# Patient Record
Sex: Female | Born: 1945 | Race: White | Hispanic: No | Smoking: Never smoker
Health system: Southern US, Community
[De-identification: ages and names within clinical notes are randomized; demographics above are authoritative.]

## PROBLEM LIST (undated history)

## (undated) DIAGNOSIS — R55 Syncope and collapse: Secondary | ICD-10-CM

## (undated) DIAGNOSIS — I10 Essential (primary) hypertension: Secondary | ICD-10-CM

## (undated) DIAGNOSIS — R079 Chest pain, unspecified: Secondary | ICD-10-CM

## (undated) HISTORY — DX: Chest pain, unspecified: R07.9

## (undated) HISTORY — PX: AUGMENTATION MAMMAPLASTY: SUR837

## (undated) HISTORY — DX: Syncope and collapse: R55

## (undated) HISTORY — PX: NASAL SINUS SURGERY: SHX719

---

## 2005-12-02 ENCOUNTER — Encounter: Admission: RE | Admit: 2005-12-02 | Discharge: 2005-12-02 | Payer: Self-pay | Admitting: *Deleted

## 2006-02-12 ENCOUNTER — Encounter: Admission: RE | Admit: 2006-02-12 | Discharge: 2006-02-12 | Payer: Self-pay | Admitting: *Deleted

## 2006-09-10 ENCOUNTER — Ambulatory Visit (HOSPITAL_COMMUNITY): Admission: RE | Admit: 2006-09-10 | Discharge: 2006-09-10 | Payer: Self-pay | Admitting: Plastic Surgery

## 2012-07-10 ENCOUNTER — Emergency Department (HOSPITAL_BASED_OUTPATIENT_CLINIC_OR_DEPARTMENT_OTHER): Payer: Federal, State, Local not specified - PPO

## 2012-07-10 ENCOUNTER — Emergency Department (HOSPITAL_BASED_OUTPATIENT_CLINIC_OR_DEPARTMENT_OTHER)
Admission: EM | Admit: 2012-07-10 | Discharge: 2012-07-10 | Disposition: A | Discharge status: Home / Self Care | Payer: Federal, State, Local not specified - PPO | Attending: Emergency Medicine | Admitting: Emergency Medicine

## 2012-07-10 ENCOUNTER — Encounter (HOSPITAL_BASED_OUTPATIENT_CLINIC_OR_DEPARTMENT_OTHER): Payer: Self-pay | Admitting: Emergency Medicine

## 2012-07-10 DIAGNOSIS — Z79899 Other long term (current) drug therapy: Secondary | ICD-10-CM | POA: Insufficient documentation

## 2012-07-10 DIAGNOSIS — R05 Cough: Secondary | ICD-10-CM

## 2012-07-10 DIAGNOSIS — R059 Cough, unspecified: Secondary | ICD-10-CM

## 2012-07-10 DIAGNOSIS — J9801 Acute bronchospasm: Secondary | ICD-10-CM

## 2012-07-10 DIAGNOSIS — J40 Bronchitis, not specified as acute or chronic: Secondary | ICD-10-CM

## 2012-07-10 DIAGNOSIS — J209 Acute bronchitis, unspecified: Secondary | ICD-10-CM | POA: Insufficient documentation

## 2012-07-10 LAB — CBC WITH DIFFERENTIAL/PLATELET
Basophils Relative: 0 % (ref 0–1)
Hemoglobin: 13.1 g/dL (ref 12.0–15.0)
MCH: 31 pg (ref 26.0–34.0)
MCHC: 34.1 g/dL (ref 30.0–36.0)
Monocytes Absolute: 0.6 10*3/uL (ref 0.1–1.0)
Monocytes Relative: 8 % (ref 3–12)
Neutro Abs: 4.9 10*3/uL (ref 1.7–7.7)
Platelets: 232 10*3/uL (ref 150–400)

## 2012-07-10 LAB — COMPREHENSIVE METABOLIC PANEL
ALT: 18 U/L (ref 0–35)
AST: 19 U/L (ref 0–37)
Albumin: 3.8 g/dL (ref 3.5–5.2)
Alkaline Phosphatase: 76 U/L (ref 39–117)
BUN: 8 mg/dL (ref 6–23)
CO2: 28 mEq/L (ref 19–32)
GFR calc Af Amer: >90 mL/min (ref >90)
GFR calc non Af Amer: 88 mL/min — ABNORMAL LOW (ref >90)
Glucose, Bld: 102 mg/dL — ABNORMAL HIGH (ref 70–99)
Potassium: 4.2 mEq/L (ref 3.5–5.1)
Total Bilirubin: 0.2 mg/dL — ABNORMAL LOW (ref 0.3–1.2)
Total Protein: 7.2 g/dL (ref 6.0–8.3)

## 2012-07-10 MED ORDER — ALBUTEROL SULFATE HFA 108 (90 BASE) MCG/ACT IN AERS: 2.0000 | INHALATION_SPRAY | RESPIRATORY_TRACT | Status: DC | PRN

## 2012-07-10 MED ORDER — IOHEXOL 350 MG/ML SOLN
100.0000 mL | Freq: Once | INTRAVENOUS | Status: AC | PRN
  Administered 2012-07-10: 100 mL via INTRAVENOUS

## 2012-07-10 MED ORDER — PREDNISONE 20 MG PO TABS: 60.0000 mg | ORAL_TABLET | Freq: Every day | ORAL | Status: DC

## 2012-07-10 MED ORDER — IPRATROPIUM BROMIDE 0.02 % IN SOLN
0.5000 mg | Freq: Once | RESPIRATORY_TRACT | Status: AC
  Administered 2012-07-10: 0.5 mg via RESPIRATORY_TRACT
  Filled 2012-07-10: qty 2.5

## 2012-07-10 MED ORDER — SODIUM CHLORIDE 0.9 % IV SOLN
INTRAVENOUS | Status: DC
  Administered 2012-07-10: 14:00:00 via INTRAVENOUS

## 2012-07-10 MED ORDER — ALBUTEROL SULFATE (5 MG/ML) 0.5% IN NEBU
5.0000 mg | INHALATION_SOLUTION | Freq: Once | RESPIRATORY_TRACT | Status: AC
  Administered 2012-07-10: 5 mg via RESPIRATORY_TRACT
  Filled 2012-07-10: qty 1

## 2012-07-10 MED ORDER — HYDROCOD POLST-CHLORPHEN POLST 10-8 MG/5ML PO LQCR: 5.0000 mL | Freq: Two times a day (BID) | ORAL | Status: DC | PRN

## 2012-07-10 MED ORDER — PREDNISONE 50 MG PO TABS
60.0000 mg | ORAL_TABLET | Freq: Once | ORAL | Status: AC
  Administered 2012-07-10: 60 mg via ORAL
  Filled 2012-07-10: qty 1

## 2012-07-10 MED ORDER — AZITHROMYCIN 250 MG PO TABS: ORAL_TABLET | ORAL | Status: DC

## 2012-07-10 NOTE — ED Provider Notes (Signed)
History     CSN: [id number]  Arrival date & time 07/10/12  1129   First MD Initiated Contact with Patient 07/10/12 1150      Chief Complaint  Patient presents with  . Cough    (Consider location/radiation/quality/duration/timing/severity/associated sxs/prior treatment) Patient is a 66 y.o. female presenting with cough. The history is provided by the patient.  Cough  pt states symptoms started a few weeks ago with sinus congestion. Was txd w amox x 10 days for possible sinus infection. Sinus congestion improved. But then developed cough, at times non productive, and other times yellow sputum. Went to urgent care in [place], and was told bronchitis, given emycin rx, states symptoms persist. Subjective fever. No night sweats. No wt loss. No hx chronic lung disease, asthma or copd. Non smoker. No current abx use.   No past medical history on file.  Past Surgical History  Procedure Date  . Nasal sinus surgery     No family history on file.  History  Substance Use Topics  . Smoking status: Never Smoker   . Smokeless tobacco: Not on file  . Alcohol Use:     OB History    Grav Para Term Preterm Abortions TAB SAB Ect Mult Living                  Review of Systems  Respiratory: Positive for cough.     Allergies  Codeine  Home Medications   Current Outpatient Rx  Name  Route  Sig  Dispense  Refill  . ESTROGENS CONJUGATED 0.625 MG PO TABS   Oral   Take 0.625 mg by mouth daily. Take daily for 21 days then do not take for 7 days.           BP 151/70  Pulse 91  Temp 98.2 F (36.8 C) (Oral)  Resp 18  Ht 5\' 1"  (1.549 m)  Wt 107 lb (48.535 kg)  BMI 20.22 kg/m2  SpO2 98%  Physical Exam  ED Course  Procedures (including critical care time)   Results for orders placed during the hospital encounter of 07/10/12  CBC WITH DIFFERENTIAL      Component Value Range   WBC 7.2  4.0 - 10.5 K/uL   RBC 4.22  3.87 - 5.11 MIL/uL   Hemoglobin 13.1  12.0 - 15.0 g/dL   HCT 30.8  65.7 - 84.6 %   MCV 91.0  78.0 - 100.0 fL   MCH 31.0  26.0 - 34.0 pg   MCHC 34.1  30.0 - 36.0 g/dL   RDW 96.2  95.2 - 84.1 %   Platelets 232  150 - 400 K/uL   Neutrophils Relative 67  43 - 77 %   Neutro Abs 4.9  1.7 - 7.7 K/uL   Lymphocytes Relative 24  12 - 46 %   Lymphs Abs 1.7  0.7 - 4.0 K/uL   Monocytes Relative 8  3 - 12 %   Monocytes Absolute 0.6  0.1 - 1.0 K/uL   Eosinophils Relative 1  0 - 5 %   Eosinophils Absolute 0.1  0.0 - 0.7 K/uL   Basophils Relative 0  0 - 1 %   Basophils Absolute 0.0  0.0 - 0.1 K/uL  COMPREHENSIVE METABOLIC PANEL      Component Value Range   Sodium 141  135 - 145 mEq/L   Potassium 4.2  3.5 - 5.1 mEq/L   Chloride 103  96 - 112 mEq/L   CO2 28  19 -  32 mEq/L   Glucose, Bld 102 (*) 70 - 99 mg/dL   BUN 8  6 - 23 mg/dL   Creatinine, Ser 6.96  0.50 - 1.10 mg/dL   Calcium 9.7  8.4 - 29.5 mg/dL   Total Protein 7.2  6.0 - 8.3 g/dL   Albumin 3.8  3.5 - 5.2 g/dL   AST 19  0 - 37 U/L   ALT 18  0 - 35 U/L   Alkaline Phosphatase 76  39 - 117 U/L   Total Bilirubin 0.2 (*) 0.3 - 1.2 mg/dL   GFR calc non Af Amer 88 (*) >90 mL/min   GFR calc Af Amer >90  >90 mL/min   Dg Chest 2 View  07/10/2012  *RADIOLOGY REPORT*  Clinical Data: Cough and bilateral chest pain.  CHEST - 2 VIEW  Comparison: None.  Findings: Two views of the chest demonstrate mild hyperinflation of the lungs.  There is no focal airspace disease or edema.  Heart size is within normal limits and the trachea is midline.  Bony thorax appears intact. There are subtle densities in the apical regions bilaterally that probably represent overlying shadows but indeterminate.  IMPRESSION: No acute cardiopulmonary disease.  Question apical densities, particularly on the left side.  This could represent overlying shadows but indeterminate.  This area could be further evaluation with a chest CT.   Original Report Authenticated By: Richarda Overlie, M.D.    Ct Chest W Contrast  07/10/2012  *RADIOLOGY REPORT*   Clinical Data: Possible apical nodules noted on recent chest x-ray. Exclude underlying malignancy.Persistent cough.  CT CHEST WITH CONTRAST  Technique:  Multidetector CT imaging of the chest was performed following the standard protocol during bolus administration of intravenous contrast.  Contrast: OMNIPAQUE IOHEXOL 350 MG/ML SOLN  Comparison: Chest x-ray 07/10/2012.  Findings:  Mediastinum: Heart size is normal. There is no significant pericardial fluid, thickening or pericardial calcification. No pathologically enlarged mediastinal or hilar lymph nodes. Esophagus is unremarkable in appearance.  Lungs/Pleura: Some linear opacities are present in the lower lobes of the lungs bilaterally, and may represent areas of subsegmental atelectasis and/or scarring.  This is very mild.  No acute consolidative airspace disease.  No pleural effusions.  No suspicious appearing pulmonary nodules or masses are identified.  Upper Abdomen: Unremarkable.  Musculoskeletal: Bilateral subglandular breast implants. There are no aggressive appearing lytic or blastic lesions noted in the visualized portions of the skeleton.  IMPRESSION: 1.  No suspicious findings to suggest malignancy.  Specifically, no apical nodules are identified. 2.  No acute findings in the thorax. 3.  Incidental findings, as above.   Original Report Authenticated By: Trudie Reed, M.D.       MDM  Cxr. Albuterol and atrovent neb.  Post neb, no wheezing. No increased wob.   ?broncospasm/bronchitis. Will rx abx, pred, alb mdi.         Suzi Roots, MD 07/10/12 [phone]

## 2012-07-10 NOTE — ED Notes (Signed)
Pt getting over recent sinus infection.  Treated with amoxicillin.  Pt continues to have productive cough, yellow sputum.  Pt also took Emycin pack which has not improved her symptoms.  Some fever.  Some sob.  Some chest soreness.

## 2014-07-17 ENCOUNTER — Telehealth: Payer: Self-pay | Admitting: Cardiovascular Disease

## 2014-07-17 NOTE — Telephone Encounter (Signed)
[facility]Dr Sharrell KuJeffrey [facility]) for appointment with Dr Tresa EndoKelly on 08/27/14.  [facility] Dr Landry DykeKelly's schedule on 08/27/14. lp

## 2014-08-09 ENCOUNTER — Encounter: Payer: Self-pay | Admitting: Cardiovascular Disease

## 2014-08-09 ENCOUNTER — Ambulatory Visit (INDEPENDENT_AMBULATORY_CARE_PROVIDER_SITE_OTHER): Payer: Federal, State, Local not specified - PPO | Admitting: Cardiovascular Disease

## 2014-08-09 VITALS — BP 149/78 | HR 66 | Resp 16 | Ht 61.0 in | Wt 110.0 lb

## 2014-08-09 DIAGNOSIS — R079 Chest pain, unspecified: Secondary | ICD-10-CM

## 2014-08-09 DIAGNOSIS — R072 Precordial pain: Secondary | ICD-10-CM

## 2014-08-09 NOTE — Patient Instructions (Signed)
Your physician has requested that you have an exercise tolerance test. For further information please visit [email or web address]. Please also follow instruction sheet, as given. We will call you with the results.

## 2014-08-11 ENCOUNTER — Encounter: Payer: Self-pay | Admitting: Cardiovascular Disease

## 2014-08-11 DIAGNOSIS — R079 Chest pain, unspecified: Secondary | ICD-10-CM

## 2014-08-11 HISTORY — DX: Chest pain, unspecified: R07.9

## 2014-08-11 NOTE — Progress Notes (Signed)
Patient ID: Sandra Bruce, female   DOB: Feb 27, 1946, 68 y.o.   MRN: [id number]      Reason for office visit Chest pain  Sandra Bruce is referred for evaluation of unrelenting moderate chest pain that has been going on for 3-4 weeks. It is present daily, all day long, although it varies in intensity. At times 8/10 in severity. It is retrosternal and radiates at times through to her back. It is not associated with, or worsened by physical activity. Meals do not worsen it, but she has noticed some temporary relief after burping (drinking a soda will help). She was scheduled for an evaluation with Dr. Kinnie ScalesMedoff, but he recommended cardiac evaluation first. His notes report that EGD performed 2012 for a similar complaint was negative. He recommended switching from PPI to famotidine, but the patient has not noticed improvement. Peptobismol does not help. She does have water brash and pyrosis, nausea without vomiting and poor appetite.  Chiropractor treatment did not help.  She has recently noticed dyspnea, after climbing a flight of stairs. Climbing stairs has no impact on the chest pain. No dyspnea during yoga. Over the summer she walked 1-2 miles/day, 3-4 days per week without difficulty.  An echo reportedly showed mild MR, otherwise normal. CT chest in 2013 (for qeustionable CXR detected nodule) showed no aortic abnormalities.  An ECG performed today (when pain reported 6/10) is normal.   She has a family history significant for premature CAD in her father, MI age 69. She was diagnosed with HTN this year. Her cholesterol is "OK" and she does not have DM.   Allergies  Allergen Reactions  . Codeine     Current Outpatient Prescriptions  Medication Sig Dispense Refill  . ALPRAZolam (XANAX PO) Take by mouth at bedtime as needed.    Marland Kitchen. estrogens, conjugated, (PREMARIN) 0.625 MG tablet Take 0.625 mg by mouth daily. Take daily for 21 days then do not take for 7 days.    . famotidine (PEPCID) 40 MG tablet  Take 40 mg by mouth 2 (two) times daily.    Marland Kitchen. olmesartan (BENICAR) 20 MG tablet Take 20 mg by mouth daily.    Marland Kitchen. zolpidem (AMBIEN) 5 MG tablet Take 5 mg by mouth at bedtime as needed for sleep.     No current facility-administered medications for this visit.    Past Medical History  Diagnosis Date  . Chest pain 08/11/2014    Past Surgical History  Procedure Laterality Date  . Nasal sinus surgery    Hysterectomy  Family History  Problem Relation Age of Onset  . Heart attack Father [id number]    History   Social History  . Marital Status: Divorced    Spouse Name: N/A    Number of Children: N/A  . Years of Education: N/A   Occupational History  . Not on file.   Social History Main Topics  . Smoking status: Never Smoker   . Smokeless tobacco: Not on file  . Alcohol Use: Not on file  . Drug Use: Not on file  . Sexual Activity: Not on file   Other Topics Concern  . Not on file   Social History Narrative    Review of systems: The patient specifically denies orthopnea, paroxysmal nocturnal dyspnea, syncope, palpitations, focal neurological deficits, intermittent claudication, lower extremity edema, unexplained weight gain, cough, hemoptysis or wheezing.  The patient also denies abdominal pain, vomiting, dysphagia, diarrhea, constipation, polyuria, polydipsia, dysuria, hematuria, frequency, urgency, abnormal bleeding or bruising, fever, chills, unexpected  weight changes, mood swings, change in skin or hair texture, change in voice quality, auditory or visual problems, allergic reactions or rashes, new musculoskeletal complaints other than usual "aches and pains".   PHYSICAL EXAM BP 149/78 mmHg  Pulse 66  Resp 16  Ht 5\' 1"  (1.549 m)  Wt 110 lb (49.896 kg)  BMI 20.80 kg/m2  General: Alert, oriented x3, no distress Head: no evidence of trauma, PERRL, EOMI, no exophtalmos or lid lag, no myxedema, no xanthelasma; normal ears, nose and oropharynx Neck: normal jugular venous  pulsations and no hepatojugular reflux; brisk carotid pulses without delay and no carotid bruits Chest: clear to auscultation, no signs of consolidation by percussion or palpation, normal fremitus, symmetrical and full respiratory excursions Cardiovascular: normal position and quality of the apical impulse, regular rhythm, normal first and second heart sounds, short early systolic murmur at LLSB, no rubs or gallops Abdomen: no tenderness or distention, no masses by palpation, no abnormal pulsatility or arterial bruits, normal bowel sounds, no hepatosplenomegaly Extremities: no clubbing, cyanosis or edema; 2+ radial, ulnar and brachial pulses bilaterally; 2+ right femoral, posterior tibial and dorsalis pedis pulses; 2+ left femoral, posterior tibial and dorsalis pedis pulses; no subclavian or femoral bruits Neurological: grossly nonfocal   EKG: NSR  Lipid Panel  No results found for: CHOL, TRIG, HDL, CHOLHDL, VLDL, LDLCALC, LDLDIRECT  BMET    Component Value Date/Time   NA 141 07/10/2012 1250   K 4.2 07/10/2012 1250   CL 103 07/10/2012 1250   CO2 28 07/10/2012 1250   GLUCOSE 102* 07/10/2012 1250   BUN 8 07/10/2012 1250   CREATININE 0.70 07/10/2012 1250   CALCIUM 9.7 07/10/2012 1250   GFRNONAA 88* 07/10/2012 1250   GFRAA >90 07/10/2012 1250     ASSESSMENT AND PLAN  Sandra Bruce chest pain is not typical for coronary etiology and the absence of any ECG changes during pain is reassuring. She has limited risk factors (family history, recent HTN). A stres test is indicated. The CT chest from 2013 makes an aortic aneurysm unlikely, but a dissection (while very unlikely) is possible. If her stress test and EGD are unrevealing, repeat chest CT is a consideration.  She has a history of early surgical menopause, fair skin and confirmed osteopenia - consider thoracic vertebral pathology as a possible cause of her symptoms. If no GI pathology is found, consider empirical NSAIDs.  Orders Placed  This Encounter  Procedures  . EKG 12-Lead  . Exercise Tolerance Test   Meds ordered this encounter  Medications  . olmesartan (BENICAR) 20 MG tablet    Sig: Take 20 mg by mouth daily.  Marland Kitchen. zolpidem (AMBIEN) 5 MG tablet    Sig: Take 5 mg by mouth at bedtime as needed for sleep.  Marland Kitchen. ALPRAZolam (XANAX PO)    Sig: Take by mouth at bedtime as needed.  . famotidine (PEPCID) 40 MG tablet    Sig: Take 40 mg by mouth 2 (two) times daily.    Junious SilkROITORU,Josip Merolla  Rastus Borton, MD, [facility] [phone] office [phone] pager

## 2014-08-27 ENCOUNTER — Ambulatory Visit: Payer: Federal, State, Local not specified - PPO | Admitting: Cardiovascular Disease

## 2014-08-31 ENCOUNTER — Encounter (HOSPITAL_COMMUNITY): Payer: Self-pay | Admitting: *Deleted

## 2014-09-06 ENCOUNTER — Telehealth (HOSPITAL_COMMUNITY): Payer: Self-pay

## 2014-09-06 NOTE — Telephone Encounter (Signed)
Encounter complete. 

## 2014-09-07 ENCOUNTER — Telehealth (HOSPITAL_COMMUNITY): Payer: Self-pay

## 2014-09-07 NOTE — Telephone Encounter (Signed)
Encounter complete. 

## 2014-09-11 ENCOUNTER — Ambulatory Visit (HOSPITAL_COMMUNITY)
Admission: RE | Admit: 2014-09-11 | Discharge: 2014-09-11 | Disposition: A | Discharge status: Home / Self Care | Payer: Federal, State, Local not specified - PPO | Source: Ambulatory Visit | Attending: Cardiology | Admitting: Cardiology

## 2014-09-11 DIAGNOSIS — R0609 Other forms of dyspnea: Secondary | ICD-10-CM | POA: Insufficient documentation

## 2014-09-11 DIAGNOSIS — R079 Chest pain, unspecified: Secondary | ICD-10-CM

## 2014-09-11 NOTE — Procedures (Signed)
Exercise Treadmill Test   Test  Exercise Tolerance Test Ordering MD: M. Croitoru, MD  Interpreting MD:   Unique Test No: 1 Treadmill:  1  InJudie Petitdication for ETT: chest pain - rule out ischemia  Contraindication to ETT: No   Stress Modality: exercise - treadmill  Cardiac Imaging Performed: non   Protocol: standard Bruce - maximal  Max BP:  169/89  Max MPHR (bpm):  151 85% MPR (bpm): 128  MPHR obtained (bpm):  146 % MPHR obtained: 96  Reached 85% MPHR (min:sec):  3:45 Total Exercise Time (min-sec):  6:07  Workload in METS:  7.10 Borg Scale:  Reason ETT Terminated: exertional dyspnea    ST Segment Analysis At Rest: normal ST segments - no evidence of significant ST depression With Exercise: no evidence of significant ST depression  Other Information Arrhythmia:  No Angina during ETT:  absent (0) Quality of ETT:  diagnostic  ETT Interpretation:  normal - no evidence of ischemia by ST analysis  Comments: Good exercise tolerance Flat BP response to exercise Scooped ST segments, but no significant ischemic changes  Sandra NoseKenneth C. Berneda Piccininni, MD, [facility] Attending Cardiologist [facility]

## 2016-10-30 ENCOUNTER — Other Ambulatory Visit: Payer: Self-pay | Admitting: Internal Medicine

## 2016-10-30 DIAGNOSIS — Z1231 Encounter for screening mammogram for malignant neoplasm of breast: Secondary | ICD-10-CM

## 2016-11-18 ENCOUNTER — Ambulatory Visit
Admission: RE | Admit: 2016-11-18 | Discharge: 2016-11-18 | Disposition: A | Discharge status: Home / Self Care | Payer: Federal, State, Local not specified - PPO | Source: Ambulatory Visit | Attending: Internal Medicine | Admitting: Internal Medicine

## 2016-11-18 DIAGNOSIS — Z1231 Encounter for screening mammogram for malignant neoplasm of breast: Secondary | ICD-10-CM

## 2017-12-08 ENCOUNTER — Emergency Department (HOSPITAL_COMMUNITY)
Admission: EM | Admit: 2017-12-08 | Discharge: 2017-12-08 | Disposition: A | Discharge status: Home / Self Care | Payer: Federal, State, Local not specified - PPO | Attending: Emergency Medicine | Admitting: Emergency Medicine

## 2017-12-08 ENCOUNTER — Emergency Department (HOSPITAL_BASED_OUTPATIENT_CLINIC_OR_DEPARTMENT_OTHER)
Admit: 2017-12-08 | Discharge: 2017-12-08 | Disposition: A | Discharge status: Home / Self Care | Payer: Federal, State, Local not specified - PPO | Attending: Emergency Medicine | Admitting: Emergency Medicine

## 2017-12-08 ENCOUNTER — Other Ambulatory Visit: Payer: Self-pay

## 2017-12-08 ENCOUNTER — Encounter (HOSPITAL_COMMUNITY): Payer: Self-pay

## 2017-12-08 DIAGNOSIS — I10 Essential (primary) hypertension: Secondary | ICD-10-CM | POA: Insufficient documentation

## 2017-12-08 DIAGNOSIS — Z79899 Other long term (current) drug therapy: Secondary | ICD-10-CM | POA: Diagnosis not present

## 2017-12-08 DIAGNOSIS — M7989 Other specified soft tissue disorders: Secondary | ICD-10-CM | POA: Diagnosis not present

## 2017-12-08 DIAGNOSIS — Y939 Activity, unspecified: Secondary | ICD-10-CM | POA: Insufficient documentation

## 2017-12-08 DIAGNOSIS — R609 Edema, unspecified: Secondary | ICD-10-CM | POA: Diagnosis not present

## 2017-12-08 DIAGNOSIS — R208 Other disturbances of skin sensation: Secondary | ICD-10-CM | POA: Insufficient documentation

## 2017-12-08 DIAGNOSIS — Y929 Unspecified place or not applicable: Secondary | ICD-10-CM | POA: Insufficient documentation

## 2017-12-08 DIAGNOSIS — S8002XA Contusion of left knee, initial encounter: Secondary | ICD-10-CM | POA: Diagnosis not present

## 2017-12-08 DIAGNOSIS — Y999 Unspecified external cause status: Secondary | ICD-10-CM | POA: Diagnosis not present

## 2017-12-08 DIAGNOSIS — W109XXA Fall (on) (from) unspecified stairs and steps, initial encounter: Secondary | ICD-10-CM | POA: Diagnosis not present

## 2017-12-08 DIAGNOSIS — M79605 Pain in left leg: Secondary | ICD-10-CM

## 2017-12-08 DIAGNOSIS — M79604 Pain in right leg: Secondary | ICD-10-CM

## 2017-12-08 DIAGNOSIS — S8001XA Contusion of right knee, initial encounter: Secondary | ICD-10-CM | POA: Insufficient documentation

## 2017-12-08 DIAGNOSIS — S8992XA Unspecified injury of left lower leg, initial encounter: Secondary | ICD-10-CM | POA: Diagnosis present

## 2017-12-08 HISTORY — DX: Essential (primary) hypertension: I10

## 2017-12-08 LAB — CBC WITH DIFFERENTIAL/PLATELET
Basophils Absolute: 0 10*3/uL (ref 0.0–0.1)
Basophils Relative: 0 %
EOS PCT: 1 %
Eosinophils Absolute: 0 10*3/uL (ref 0.0–0.7)
HCT: 38.9 % (ref 36.0–46.0)
Hemoglobin: 12.9 g/dL (ref 12.0–15.0)
LYMPHS ABS: 1.7 10*3/uL (ref 0.7–4.0)
Lymphocytes Relative: 36 %
MCH: 30.9 pg (ref 26.0–34.0)
MCHC: 33.2 g/dL (ref 30.0–36.0)
MCV: 93.3 fL (ref 78.0–100.0)
MONO ABS: 0.4 10*3/uL (ref 0.1–1.0)
MONOS PCT: 10 %
Neutro Abs: 2.5 10*3/uL (ref 1.7–7.7)
Neutrophils Relative %: 53 %
PLATELETS: 221 10*3/uL (ref 150–400)
RBC: 4.17 MIL/uL (ref 3.87–5.11)
RDW: 13.3 % (ref 11.5–15.5)
WBC: 4.6 10*3/uL (ref 4.0–10.5)

## 2017-12-08 LAB — BASIC METABOLIC PANEL
Anion gap: 11 (ref 5–15)
BUN: 13 mg/dL (ref 6–20)
CO2: 23 mmol/L (ref 22–32)
CREATININE: 0.72 mg/dL (ref 0.44–1.00)
Calcium: 9.5 mg/dL (ref 8.9–10.3)
Chloride: 105 mmol/L (ref 101–111)
GFR calc Af Amer: >60 mL/min (ref >60)
GLUCOSE: 97 mg/dL (ref 65–99)
Potassium: 4 mmol/L (ref 3.5–5.1)
SODIUM: 139 mmol/L (ref 135–145)

## 2017-12-08 NOTE — ED Triage Notes (Signed)
She c/o bilat. Lower leg swelling and feeling of "tighness". She cites recent car drive to [place] (~ 5 hours). She is in no distress and her breathing is normal. She states these symptoms are equal bilat.

## 2017-12-08 NOTE — ED Provider Notes (Signed)
[facility]   CSN: [id number] Arrival date & time: 12/08/17  0755     History   Chief Complaint Chief Complaint  Patient presents with  . Leg Swelling    HPI Sandra Bruce is a 72 y.o. female.  72 yo F with a chief complaint of bilateral leg pain.  This been going on for the past couple weeks.  The patient fell down a few steps a couple weeks ago and does not remember the exact incident.  She was ambulatory and so did not worry about her leg pain.  Now it is been about 2 weeks and she feels that the pain is gotten somewhat worse.  The worsening occurred after she drove to [place] and back over the weekend.  Describes a burning sensation to the medial aspect of both knees that radiates down to bilateral ankles.  Never had this pain before.  Continues to be able to ambulate without difficulty.  She denies headache neck pain chest pain abdominal pain back pain.  Denies loss of bowel or bladder denies loss perirectal sensation.  Denies leg weakness or numbness.  Denies any medication changes.  Has not seen another medical provider for this.  The history is provided by the patient.  Injury  This is a new problem. The current episode started more than 1 week ago. The problem occurs constantly. The problem has been gradually worsening. Pertinent negatives include no chest pain, no headaches and no shortness of breath. Nothing aggravates the symptoms. Nothing relieves the symptoms. She has tried nothing for the symptoms. The treatment provided no relief.    Past Medical History:  Diagnosis Date  . Chest pain 08/11/2014  . Hypertension     Patient Active Problem List   Diagnosis Date Noted  . Chest pain 08/11/2014    Past Surgical History:  Procedure Laterality Date  . AUGMENTATION MAMMAPLASTY Bilateral   . CESAREAN SECTION    . NASAL SINUS SURGERY       OB History   None      Home Medications    Prior to Admission  medications   Medication Sig Start Date End Date Taking? Authorizing Provider  ALPRAZolam (XANAX PO) Take by mouth at bedtime as needed.    [provider]  estrogens, conjugated, (PREMARIN) 0.625 MG tablet Take 0.625 mg by mouth daily. Take daily for 21 days then do not take for 7 days.    [provider]  famotidine (PEPCID) 40 MG tablet Take 40 mg by mouth 2 (two) times daily.    [provider]  olmesartan (BENICAR) 20 MG tablet Take 20 mg by mouth daily.    [provider]  zolpidem (AMBIEN) 5 MG tablet Take 5 mg by mouth at bedtime as needed for sleep.    [provider]    Family History Family History  Problem Relation Age of Onset  . Heart attack Father   . Breast cancer Neg Hx     Social History Social History   Tobacco Use  . Smoking status: Never Smoker  . Smokeless tobacco: Never Used  Substance Use Topics  . Alcohol use: Not on file  . Drug use: Not on file     Allergies   Codeine and Hydrocodone   Review of Systems Review of Systems  Constitutional: Negative for chills and fever.  HENT: Negative for congestion and rhinorrhea.   Eyes: Negative for redness and visual disturbance.  Respiratory: Negative for  shortness of breath and wheezing.   Cardiovascular: Negative for chest pain and palpitations.  Gastrointestinal: Negative for nausea and vomiting.  Genitourinary: Negative for dysuria and urgency.  Musculoskeletal: Positive for arthralgias and myalgias.  Skin: Negative for pallor and wound.  Neurological: Negative for dizziness and headaches.     Physical Exam Updated Vital Signs BP (!) 164/90 (BP Location: Left Arm)   Pulse (!) 55   Temp 97.7 F (36.5 C) (Oral)   Resp 17   SpO2 97%   Physical Exam  Constitutional: She is oriented to person, place, and time. She appears well-developed and well-nourished. No distress.  HENT:  Head: Normocephalic and atraumatic.  Eyes: Pupils are equal, round, and  reactive to light. EOM are normal.  Neck: Normal range of motion. Neck supple.  Cardiovascular: Normal rate and regular rhythm. Exam reveals no gallop and no friction rub.  No murmur heard. Pulmonary/Chest: Effort normal. She has no wheezes. She has no rales.  Abdominal: Soft. She exhibits no distension. There is no tenderness.  Musculoskeletal: She exhibits no edema or tenderness.  Bruises to anterior aspect of both knees.  PMS intact distally bilaterally.  No clonus, reflexes 2+ and equal.  Negative babinski.   Neurological: She is alert and oriented to person, place, and time. She displays no Babinski's sign on the right side. She displays no Babinski's sign on the left side.  Reflex Scores:      Patellar reflexes are 2+ on the right side and 2+ on the left side.      Achilles reflexes are 2+ on the right side and 2+ on the left side. No clonus to BLE  Skin: Skin is warm and dry. She is not diaphoretic.  Psychiatric: She has a normal mood and affect. Her behavior is normal.  Nursing note and vitals reviewed.    ED Treatments / Results  Labs (all labs ordered are listed, but only abnormal results are displayed) Labs Reviewed  CBC WITH DIFFERENTIAL/PLATELET  BASIC METABOLIC PANEL    EKG None  Radiology No results found.  Procedures Procedures (including critical care time)  Medications Ordered in ED Medications - No data to display   Initial Impression / Assessment and Plan / ED Course  I have reviewed the triage vital signs and the nursing notes.  Pertinent labs & imaging results that were available during my care of the patient were reviewed by me and considered in my medical decision making (see chart for details).     72 yo F with bilateral leg pain.  Describes it as a burning sensation.  No history of this previously.  Posttraumatic.  The patient has no weakness no other signs of cauda equina no back pain.  Will obtain DVT study, basic labs. Negative.  PCP,  neurofollow up.  11:01 AM:  I have discussed the diagnosis/risks/treatment options with the patient and believe the pt to be eligible for discharge home to follow-up with PCP, neuro. We also discussed returning to the ED immediately if new or worsening sx occur. We discussed the sx which are most concerning (e.g., sudden worsening pain, fever, inability to tolerate by mouth ) that necessitate immediate return. Medications administered to the patient during their visit and any new prescriptions provided to the patient are listed below.  Medications given during this visit Medications - No data to display  Labs reviewed cbc, bmp unremarkable Images reviewed vas lab [place] with compressible veins DDX neuropathy, traumatic leg swelling,  Old records reviewed hx of  chronic neck pain with multiple visits.   The patient appears reasonably screen and/or stabilized for discharge and I doubt any other medical condition or other [facility] requiring further screening, evaluation, or treatment in the ED at this time prior to discharge.    Final Clinical Impressions(s) / ED Diagnoses   Final diagnoses:  Leg swelling  Pain in both lower extremities    ED Discharge Orders        Ordered    Ambulatory referral to Neurology    Comments:  Bilateral lower extremity paresthesias, new post trauma   12/08/17 1007       Melene Plan, DO 12/08/17 1101

## 2017-12-08 NOTE — Progress Notes (Signed)
Bilateral lower extremity venous duplex completed. There is no evidenceof a DVT, superficial thrombosis, or Baker's cyst/ [name], RVS 12/08/2017 10:05 AM

## 2017-12-08 NOTE — ED Notes (Signed)
ED Provider at bedside. 

## 2017-12-08 NOTE — Discharge Instructions (Signed)
Follow up with your PCP.  I think you should also follow up with neuro, they should call to schedule an appointment.

## 2017-12-30 ENCOUNTER — Other Ambulatory Visit: Payer: Self-pay | Admitting: Internal Medicine

## 2017-12-30 DIAGNOSIS — Z1231 Encounter for screening mammogram for malignant neoplasm of breast: Secondary | ICD-10-CM

## 2018-01-31 ENCOUNTER — Ambulatory Visit: Payer: Federal, State, Local not specified - PPO

## 2018-02-23 ENCOUNTER — Ambulatory Visit: Payer: Federal, State, Local not specified - PPO

## 2018-03-10 ENCOUNTER — Ambulatory Visit: Payer: Federal, State, Local not specified - PPO

## 2018-03-24 ENCOUNTER — Ambulatory Visit
Admission: RE | Admit: 2018-03-24 | Discharge: 2018-03-24 | Disposition: A | Discharge status: Home / Self Care | Payer: Federal, State, Local not specified - PPO | Source: Ambulatory Visit | Attending: Internal Medicine | Admitting: Internal Medicine

## 2018-03-24 DIAGNOSIS — Z1231 Encounter for screening mammogram for malignant neoplasm of breast: Secondary | ICD-10-CM

## 2020-02-15 ENCOUNTER — Ambulatory Visit: Payer: Federal, State, Local not specified - PPO | Admitting: Internal Medicine

## 2020-02-28 ENCOUNTER — Ambulatory Visit: Payer: Federal, State, Local not specified - PPO | Admitting: Internal Medicine

## 2020-02-28 ENCOUNTER — Other Ambulatory Visit: Payer: Self-pay

## 2020-02-28 ENCOUNTER — Encounter: Payer: Self-pay | Admitting: Internal Medicine

## 2020-02-28 VITALS — BP 140/90 | HR 64 | Ht 60.0 in | Wt 121.0 lb

## 2020-02-28 DIAGNOSIS — E785 Hyperlipidemia, unspecified: Secondary | ICD-10-CM | POA: Diagnosis not present

## 2020-02-28 DIAGNOSIS — R55 Syncope and collapse: Secondary | ICD-10-CM

## 2020-02-28 DIAGNOSIS — I10 Essential (primary) hypertension: Secondary | ICD-10-CM

## 2020-02-28 MED ORDER — LOSARTAN POTASSIUM 25 MG PO TABS
25.0000 mg | ORAL_TABLET | Freq: Every day | ORAL | 3 refills | Status: DC
Start: 2020-02-28 — End: 2020-03-13

## 2020-02-28 MED ORDER — HYDRALAZINE HCL 25 MG PO TABS: ORAL_TABLET | ORAL | 6 refills | Status: AC

## 2020-02-28 NOTE — Progress Notes (Signed)
Cardiology Office Note:    Date:  02/28/2020   ID:  Sandra Bruce, [place] 1945-08-25, MRN [id number]  PCP:  Domingo SepLokesh, Anitha, MD  Cardiologist:  Parke PoissonGayatri A Lyrica Mcclarty, MD  Electrophysiologist:  None   Referring MD: Domingo SepLokesh, Anitha, MD   Chief Complaint: syncope, HTN  History of Present Illness:    Sandra Bruce is a 74 y.o. female with a history of HTN, HLD, depression, insomnia. She has had very difficult to control HTN and has worked with her PCP Dr. Suzy BouchardLokesh in the past to optimize her regimen. She presents for evaluation of presyncopal episodes and HTN management.   She had two episodes of syncope, one a few years ago while walking and another recently [place] she reached the street level. Unclear if blood pressure is low at these time. She is intolerant of several medications for antihypertensive therapy. She has had a workup for secondary causes of HTN which was negative. She reports unremarkable cardiac testing with recent echo, and prior Holter. Previously seen in our practice for chest pain. Normal ETT 2016 - 6 min, 7 mets. Takes clonidine as her only antihypertensive at the moment. She is less inclined to take a beta blocker given prior experience. BP remains labile. Denies significant chest pain or shortness of breath currently.   Intolerances: Olmesartan - diarrhea which was significant Atenolol - nausea Chlorthalidone - nausea Toprol XL - dizziness  Other: Labetalol - ineffective.   Past Medical History:  Diagnosis Date  . Chest pain 08/11/2014  . Hypertension     Past Surgical History:  Procedure Laterality Date  . AUGMENTATION MAMMAPLASTY Bilateral   . CESAREAN SECTION    . NASAL SINUS SURGERY      Current Medications: Current Meds  Medication Sig  . ALPRAZolam (XANAX PO) Take 0.25 mg by mouth at bedtime as needed.   . Cyanocobalamin 5000 MCG SUBL Place 1 mg under the tongue daily.  .  CYANOCOBALAMIN PO Place 1 tablet under the tongue daily.  Marland Kitchen. estrogens, conjugated, (PREMARIN) 0.625 MG tablet Take 0.625 mg by mouth daily. Take daily for 21 days then do not take for 7 days.  . famotidine (PEPCID) 40 MG tablet Take 40 mg by mouth 2 (two) times daily.  Marland Kitchen. levothyroxine (SYNTHROID) 25 MCG tablet Take 1 tablet by mouth daily.  . mirtazapine (REMERON) 15 MG tablet Take 15 mg by mouth at bedtime.  . montelukast (SINGULAIR) 10 MG tablet Take 1 tablet by mouth as needed.  . Multiple Vitamin (MULTI-VITAMIN) tablet Take 1 tablet by mouth daily.  Marland Kitchen. omeprazole (PRILOSEC) 40 MG capsule Take 40 mg by mouth daily.  . [DISCONTINUED] cloNIDine (CATAPRES) 0.1 MG tablet Take 0.1 mg by mouth 2 (two) times daily.  . [DISCONTINUED] olmesartan (BENICAR) 20 MG tablet Take 20 mg by mouth daily.  . [DISCONTINUED] zolpidem (AMBIEN) 5 MG tablet Take 5 mg by mouth at bedtime as needed for sleep.     Allergies:   Codeine and Hydrocodone   Social History   Socioeconomic History  . Marital status: Divorced    Spouse name: Not on file  . Number of children: Not on file  . Years of education: Not on file  . Highest education level: Not on file  Occupational History  . Not on file  Tobacco Use  . Smoking status: Never Smoker  . Smokeless tobacco: Never Used  Substance and Sexual Activity  . Alcohol use:  Not on file  . Drug use: Not on file  . Sexual activity: Not on file  Other Topics Concern  . Not on file  Social History Narrative  . Not on file   Social Determinants of Health   Financial Resource Strain:   . Difficulty of Paying Living Expenses:   Food Insecurity:   . Worried About Programme researcher, broadcasting/film/video in the Last Year:   . Barista in the Last Year:   Transportation Needs:   . Freight forwarder (Medical):   Marland Kitchen Lack of Transportation (Non-Medical):   Physical Activity:   . Days of Exercise per Week:   . Minutes of Exercise per Session:   Stress:   . Feeling of Stress :    Social Connections:   . Frequency of Communication with Friends and Family:   . Frequency of Social Gatherings with Friends and Family:   . Attends Religious Services:   . Active Member of Clubs or Organizations:   . Attends Banker Meetings:   Marland Kitchen Marital Status:      Family History: The patient's family history includes Heart attack in her father. There is no history of Breast cancer.  ROS:   Please see the history of present illness.    All other systems reviewed and are negative.  EKGs/Labs/Other Studies Reviewed:    The following studies were reviewed today:  EKG:  NSR, rate 64.  Recent Labs: 03/12/2020: BUN 10; Creatinine, Ser 0.85; Potassium 4.3; Sodium 137  Recent Lipid Panel No results found for: CHOL, TRIG, HDL, CHOLHDL, VLDL, LDLCALC, LDLDIRECT  Physical Exam:    VS:  BP 140/90 (BP Location: Right Arm, Patient Position: Sitting, Cuff Size: Normal)   Pulse 64   Ht 5' (1.524 m)   Wt 121 lb (54.9 kg)   SpO2 96%   BMI 23.63 kg/m     Wt Readings from Last 5 Encounters:  03/13/20 123 lb (55.8 kg)  02/28/20 121 lb (54.9 kg)  08/09/14 110 lb (49.9 kg)  07/10/12 107 lb (48.5 kg)     Constitutional: No acute distress Eyes: sclera non-icteric, normal conjunctiva and lids ENMT: normal dentition, moist mucous membranes Cardiovascular: regular rhythm, normal rate, no murmurs. S1 and S2 normal.No jugular venous distention.  Respiratory: clear to auscultation bilaterally GI : normal bowel sounds, soft and nontender. No distention.   MSK: extremities warm, well perfused. No edema.  NEURO: grossly nonfocal exam, moves all extremities. PSYCH: alert and oriented x 3, normal mood and affect.   ASSESSMENT:    1. Essential hypertension   2. Syncope and collapse   3. Hyperlipidemia, unspecified hyperlipidemia type    PLAN:    Essential hypertension - Plan: EKG 12-Lead, Basic metabolic panel She is willing to try another ARB, since we discussed that  olmesartan can lead to drug-related enteropathy. We will try losartan 25 mg daily. We discussed that she can try prn hydralazine as opposed to scheduled for BP >160/100 as we titrate therapy. If she finds she needs to use it frequently, we will plan for scheduled dosing. We will stop clonidine in favor of losartan.  - I would like to refer her to Dr. Leonides Sake HTN clinic within our office for further evaluation.  - I will bring her back in 7-10 days for BMP and med titration with losartan.   Syncope and collapse  - She has had cardiovascular evaluations over the years that have been normal. I am not able to see  the report from her recent echocardiogram but she tells me this was unremarkable. She has had a cardiac monitor previously in the context of her prior syncopal episode. I have offered her repeat monitor and echo given recurrent episode of syncopal symptoms, we participated in shared decision making and she deferred. I am more suspicious of labile blood pressure than structural causes for hypotension and syncope, but we will observe these symptoms.   HLD - most recent values LDL 122, HD 89, Trig 118 from 2019. No recent lipid panel available. Consider repeat at follow up.    Weston Brass, MD [place]  [facility]    Medication Adjustments/Labs and Tests Ordered: Current medicines are reviewed at length with the patient today.  Concerns regarding medicines are outlined above.  Orders Placed This Encounter  Procedures  . Basic metabolic panel  . EKG 12-Lead   Meds ordered this encounter  Medications  . DISCONTD: losartan (COZAAR) 25 MG tablet    Sig: Take 1 tablet (25 mg total) by mouth daily.    Dispense:  90 tablet    Refill:  3  . hydrALAZINE (APRESOLINE) 25 MG tablet    Sig: Take  25 mg tablet twice a day if needed for blood pressure greater than 160/100    Dispense:  60 tablet    Refill:  6    Patient Instructions  Medication Instructions:    Stop taking  clonidine - may stop today    Losartan  25 mg one tablet  Daily   Take as needed Hydralazine 25 mg  Twice ady if blood pressure is greater than 160/100   *If you need a refill on your cardiac medications before your next appointment, please call your pharmacy*   Lab Work: BMP  In 7 to 10 days- the day before  Appointment   If you have labs (blood work) drawn today and your tests are completely normal, you will receive your results only by: Marland Kitchen MyChart Message (if you have MyChart) OR . A paper copy in the mail If you have any lab test that is abnormal or we need to change your treatment, we will call you to review the results.   Testing/Procedures: Not needed   Follow-Up: At [facility], you and your health needs are our priority.  As part of our continuing mission to provide you with exceptional heart care, we have created designated Provider Care Teams.  These Care Teams include your primary Cardiologist (physician) and Advanced Practice Providers (APPs -  Physician Assistants and Nurse Practitioners) who all work together to provide you with the care you need, when you need it.  We recommend signing up for the patient portal called "MyChart".  Sign up information is provided on this After Visit Summary.  MyChart is used to connect with patients for Virtual Visits (Telemedicine).  Patients are able to view lab/test results, encounter notes, upcoming appointments, etc.  Non-urgent messages can be sent to your provider as well.   To learn more about what you can do with MyChart, go to [email or web address].    Your next appointment:    7 TO 10 day(s)  The format for your next appointment:   In Person  Provider:   You may see  Dr Duke Salvia or one of the following Advanced Practice Providers on your designated Care Team:    Theodore Demark, PA-C  Joni Reining, DNP, ANP  Cadence Fransico Michael, NP    Other Instructions You have been referred to  DR  Duke Salvia - hypertension  clinic

## 2020-02-28 NOTE — Patient Instructions (Signed)
Medication Instructions:    Stop taking clonidine - may stop today    Losartan  25 mg one tablet  Daily   Take as needed Hydralazine 25 mg  Twice ady if blood pressure is greater than 160/100   *If you need a refill on your cardiac medications before your next appointment, please call your pharmacy*   Lab Work: BMP  In 7 to 10 days- the day before  Appointment   If you have labs (blood work) drawn today and your tests are completely normal, you will receive your results only by: Marland Kitchen MyChart Message (if you have MyChart) OR . A paper copy in the mail If you have any lab test that is abnormal or we need to change your treatment, we will call you to review the results.   Testing/Procedures: Not needed   Follow-Up: At [facility], you and your health needs are our priority.  As part of our continuing mission to provide you with exceptional heart care, we have created designated Provider Care Teams.  These Care Teams include your primary Cardiologist (physician) and Advanced Practice Providers (APPs -  Physician Assistants and Nurse Practitioners) who all work together to provide you with the care you need, when you need it.  We recommend signing up for the patient portal called "MyChart".  Sign up information is provided on this After Visit Summary.  MyChart is used to connect with patients for Virtual Visits (Telemedicine).  Patients are able to view lab/test results, encounter notes, upcoming appointments, etc.  Non-urgent messages can be sent to your provider as well.   To learn more about what you can do with MyChart, go to [email or web address].    Your next appointment:    7 TO 10 day(s)  The format for your next appointment:   In Person  Provider:   You may see  Dr Duke Salvia or one of the following Advanced Practice Providers on your designated Care Team:    Theodore Demark, PA-C  Joni Reining, DNP, ANP  Cadence Fransico Michael, NP    Other Instructions You have been  referred to  DR Duke Salvia - hypertension clinic

## 2020-03-12 LAB — BASIC METABOLIC PANEL
BUN/Creatinine Ratio: 12 (ref 12–28)
BUN: 10 mg/dL (ref 8–27)
CO2: 20 mmol/L (ref 20–29)
Calcium: 9.5 mg/dL (ref 8.7–10.3)
Chloride: 102 mmol/L (ref 96–106)
Creatinine, Ser: 0.85 mg/dL (ref 0.57–1.00)
GFR calc Af Amer: 78 mL/min/{1.73_m2} (ref >59)
GFR calc non Af Amer: 68 mL/min/{1.73_m2} (ref >59)
Glucose: 123 mg/dL — ABNORMAL HIGH (ref 65–99)
Potassium: 4.3 mmol/L (ref 3.5–5.2)
Sodium: 137 mmol/L (ref 134–144)

## 2020-03-13 ENCOUNTER — Ambulatory Visit: Payer: Federal, State, Local not specified - PPO | Admitting: Adult Health

## 2020-03-13 ENCOUNTER — Encounter: Payer: Self-pay | Admitting: Adult Health

## 2020-03-13 ENCOUNTER — Other Ambulatory Visit: Payer: Self-pay

## 2020-03-13 VITALS — BP 122/70 | HR 78 | Ht 60.0 in | Wt 123.0 lb

## 2020-03-13 DIAGNOSIS — E039 Hypothyroidism, unspecified: Secondary | ICD-10-CM

## 2020-03-13 DIAGNOSIS — I10 Essential (primary) hypertension: Secondary | ICD-10-CM | POA: Diagnosis not present

## 2020-03-13 MED ORDER — LOSARTAN POTASSIUM 25 MG PO TABS: 25.0000 mg | ORAL_TABLET | Freq: Every day | ORAL | 3 refills | Status: DC

## 2020-03-13 NOTE — Patient Instructions (Signed)
Medication Instructions:  Continue current medications  *If you need a refill on your cardiac medications before your next appointment, please call your pharmacy*   Lab Work: None Ordered   Testing/Procedures: Your physician has requested that you have an echocardiogram. Echocardiography is a painless test that uses sound waves to create images of your heart. It provides your doctor with information about the size and shape of your heart and how well your heart's chambers and valves are working. This procedure takes approximately one hour. There are no restrictions for this procedure.   Follow-Up: At [facility], you and your health needs are our priority.  As part of our continuing mission to provide you with exceptional heart care, we have created designated Provider Care Teams.  These Care Teams include your primary Cardiologist (physician) and Advanced Practice Providers (APPs -  Physician Assistants and Nurse Practitioners) who all work together to provide you with the care you need, when you need it.  We recommend signing up for the patient portal called "MyChart".  Sign up information is provided on this After Visit Summary.  MyChart is used to connect with patients for Virtual Visits (Telemedicine).  Patients are able to view lab/test results, encounter notes, upcoming appointments, etc.  Non-urgent messages can be sent to your provider as well.   To learn more about what you can do with MyChart, go to [email or web address].    Your next appointment:   Keep appointment with Dr Duke Salvia July 26th @ 3:30 pm  The format for your next appointment:   In Person  Provider:   Chilton Si, MD

## 2020-03-13 NOTE — Progress Notes (Signed)
Cardiology Office Note   Date:  03/13/2020   ID:  Camerin, Jimenez [birth year], MRN [id number]  PCP:  Domingo Sep, MD  Cardiologist:  Dr.Acharya  No chief complaint on file.    History of Present Illness: Sandra Bruce is a 74 y.o. female who presents for ongoing assessment and management of chest pain and hypertension.  She had been evaluated by Dr. Royann Shivers in 2015 and felt that it was atypical.  She had a stress test and an EGD that was unrevealing.    She saw Dr.Acharya on February 28, 2020 was placed on losartan and as needed hydralazine for blood pressure greater than 160/100.  She has kept a blood pressure log and initially blood pressure remained elevated for the first 2 days but is now come down into the 130s systolic with some low blood pressures of 117 and 110 systolic.  She has had a couple of "weak spells" but it was not related to blood pressure.  She had a weak spell with a low blood pressure of 110/70, and a weak spell with a blood pressure of 161/77.  She has only taken 4 doses of hydralazine over a 2-week period.   She states she has been feeling fine with the exception of those brief weak spells.  She denies denies any rapid heart rhythm, syncope or near syncope since starting losartan.  Follow-up labs, (March 12, 2020), sodium 137 potassium 4.3 chloride 102 creatinine 0.85.  Past Medical History:  Diagnosis Date  . Chest pain 08/11/2014  . Hypertension     Past Surgical History:  Procedure Laterality Date  . AUGMENTATION MAMMAPLASTY Bilateral   . CESAREAN SECTION    . NASAL SINUS SURGERY       Current Outpatient Medications  Medication Sig Dispense Refill  . ALPRAZolam (XANAX PO) Take 0.25 mg by mouth at bedtime as needed.     . Cyanocobalamin 5000 MCG SUBL Place 1 mg under the tongue daily.    . CYANOCOBALAMIN PO Place 1 tablet under the tongue daily.    Marland Kitchen estrogens, conjugated, (PREMARIN) 0.625 MG tablet Take 0.625 mg by mouth daily. Take daily for 21 days then do  not take for 7 days.    . famotidine (PEPCID) 40 MG tablet Take 40 mg by mouth 2 (two) times daily.    . hydrALAZINE (APRESOLINE) 25 MG tablet Take  25 mg tablet twice a day if needed for blood pressure greater than 160/100 60 tablet 6  . levothyroxine (SYNTHROID) 25 MCG tablet Take 1 tablet by mouth daily.    Marland Kitchen losartan (COZAAR) 25 MG tablet Take 1 tablet (25 mg total) by mouth daily. 90 tablet 3  . mirtazapine (REMERON) 15 MG tablet Take 15 mg by mouth at bedtime.    . montelukast (SINGULAIR) 10 MG tablet Take 1 tablet by mouth as needed.    . Multiple Vitamin (MULTI-VITAMIN) tablet Take 1 tablet by mouth daily.    Marland Kitchen omeprazole (PRILOSEC) 40 MG capsule Take 40 mg by mouth daily.     No current facility-administered medications for this visit.    Allergies:   Codeine and Hydrocodone    Social History:  The patient  reports that she has never smoked. She has never used smokeless tobacco.   Family History:  The patient's family history includes Heart attack in her father.    ROS: All other systems are reviewed and negative. Unless otherwise mentioned in H&P    PHYSICAL EXAM: VS:  BP 122/70  Pulse 78   Ht 5' (1.524 m)   Wt 123 lb (55.8 kg)   SpO2 96%   BMI 24.02 kg/m  , BMI Body mass index is 24.02 kg/m. GEN: Well nourished, well developed, in no acute distress HEENT: normal Neck: no JVD, carotid bruits, or masses Cardiac: RRR; no murmurs, rubs, or gallops,no edema  Respiratory:  Clear to auscultation bilaterally, normal work of breathing GI: soft, nontender, nondistended, + BS MS: no deformity or atrophy Skin: warm and dry, no rash Neuro:  Strength and sensation are intact Psych: euthymic mood, full affect   EKG: Not completed this office visit  Recent Labs: 03/12/2020: BUN 10; Creatinine, Ser 0.85; Potassium 4.3; Sodium 137    Lipid Panel No results found for: CHOL, TRIG, HDL, CHOLHDL, VLDL, LDLCALC, LDLDIRECT    Wt Readings from Last 3 Encounters:  03/13/20  123 lb (55.8 kg)  02/28/20 121 lb (54.9 kg)  08/09/14 110 lb (49.9 kg)      Other studies Reviewed: None available    ASSESSMENT AND PLAN:  1.  Hypertension: Apparently this has been difficult for her to control in the past.  She does not tolerate clonidine as discussed with significant diarrhea, and she did not tolerate Benicar for the same reason.  She states it usually takes about 8 months for her to be finding intolerances to her medication which is usually noted by more more frequent diarrhea while taking the medication.  When she stops the medication the diarrhea subsides.  I reviewed her labs and they are found to be normal.  I looked in Care Everywhere and saw that she did have an echocardiogram completed but it was not available to be read.  This was completed in 2019.  Note from [facility], Dr. Shawnee Knapp visit on December 21, 2019 documented that she had an ambulatory blood pressure monitor for 24 hours which demonstrated practically normal blood pressure readings.   I will repeat her echocardiogram for evaluation of LV function and for diastolic dysfunction.  She is due to follow-up with Dr. Duke Salvia on March 18, 2020.  2.  Hypothyroidism: She will follow-up with PCP for ongoing management of labs and dosing of levothyroxine.  Current medicines are reviewed at length with the patient today.  I have spent 25 minutes dedicated to the care of this patient on the date of this encounter to include pre-visit review of records, assessment, management and diagnostic testing,with shared decision making  Labs/ tests ordered today include: Echocardiogram  Bettey Mare. Liborio Nixon, ANP, AACC   03/13/2020 4:23 PM    [facility] [address] Office [phone] Fax [phone]  Notice: This dictation was prepared with Dragon dictation along with smaller phrase technology. Any transcriptional errors that result from this process are  unintentional and may not be corrected upon review.

## 2020-03-18 ENCOUNTER — Ambulatory Visit: Payer: Federal, State, Local not specified - PPO | Admitting: Cardiovascular Disease

## 2020-03-20 ENCOUNTER — Telehealth: Payer: Self-pay | Admitting: Internal Medicine

## 2020-03-20 NOTE — Telephone Encounter (Signed)
Vernona Rieger from the [employer] called. She would like office visit notes from her visit with Dr. Jacques Navy 02/28/20 faxed to her. Their fax number is  [phone]

## 2020-03-26 ENCOUNTER — Other Ambulatory Visit (HOSPITAL_COMMUNITY): Payer: Federal, State, Local not specified - PPO

## 2020-03-26 NOTE — Telephone Encounter (Signed)
I left message nformation given the requested office note has been faxed

## 2020-04-03 ENCOUNTER — Other Ambulatory Visit: Payer: Self-pay

## 2020-04-03 ENCOUNTER — Ambulatory Visit (HOSPITAL_COMMUNITY): Payer: Federal, State, Local not specified - PPO | Attending: Cardiology

## 2020-04-03 DIAGNOSIS — I10 Essential (primary) hypertension: Secondary | ICD-10-CM

## 2020-04-03 LAB — ECHOCARDIOGRAM COMPLETE
Area-P 1/2: 3.26 cm2
S' Lateral: 2 cm

## 2020-08-13 ENCOUNTER — Other Ambulatory Visit: Payer: Self-pay | Admitting: Internal Medicine

## 2021-01-29 ENCOUNTER — Other Ambulatory Visit: Payer: Self-pay | Admitting: Internal Medicine

## 2021-01-29 DIAGNOSIS — Z1231 Encounter for screening mammogram for malignant neoplasm of breast: Secondary | ICD-10-CM

## 2021-03-20 ENCOUNTER — Other Ambulatory Visit: Payer: Self-pay

## 2021-03-20 ENCOUNTER — Emergency Department (HOSPITAL_BASED_OUTPATIENT_CLINIC_OR_DEPARTMENT_OTHER): Payer: Federal, State, Local not specified - PPO | Admitting: Radiology

## 2021-03-20 ENCOUNTER — Emergency Department (HOSPITAL_BASED_OUTPATIENT_CLINIC_OR_DEPARTMENT_OTHER)
Admission: EM | Admit: 2021-03-20 | Discharge: 2021-03-20 | Disposition: A | Discharge status: Home / Self Care | Payer: Federal, State, Local not specified - PPO | Attending: Emergency Medicine | Admitting: Emergency Medicine

## 2021-03-20 ENCOUNTER — Emergency Department (HOSPITAL_BASED_OUTPATIENT_CLINIC_OR_DEPARTMENT_OTHER): Payer: Federal, State, Local not specified - PPO

## 2021-03-20 ENCOUNTER — Encounter (HOSPITAL_BASED_OUTPATIENT_CLINIC_OR_DEPARTMENT_OTHER): Payer: Self-pay | Admitting: Emergency Medicine

## 2021-03-20 DIAGNOSIS — M25532 Pain in left wrist: Secondary | ICD-10-CM | POA: Insufficient documentation

## 2021-03-20 DIAGNOSIS — M25531 Pain in right wrist: Secondary | ICD-10-CM | POA: Diagnosis not present

## 2021-03-20 DIAGNOSIS — S0993XA Unspecified injury of face, initial encounter: Secondary | ICD-10-CM | POA: Diagnosis present

## 2021-03-20 DIAGNOSIS — Z5321 Procedure and treatment not carried out due to patient leaving prior to being seen by health care provider: Secondary | ICD-10-CM | POA: Diagnosis not present

## 2021-03-20 DIAGNOSIS — S0181XA Laceration without foreign body of other part of head, initial encounter: Secondary | ICD-10-CM | POA: Insufficient documentation

## 2021-03-20 DIAGNOSIS — M25561 Pain in right knee: Secondary | ICD-10-CM | POA: Diagnosis not present

## 2021-03-20 DIAGNOSIS — W010XXA Fall on same level from slipping, tripping and stumbling without subsequent striking against object, initial encounter: Secondary | ICD-10-CM | POA: Insufficient documentation

## 2021-03-20 NOTE — ED Triage Notes (Signed)
Pt via pov from [facility] after tripping on a sidewalk and falling. Pt has abrasion to the bridge of her nose and two lacerations to the forehead. Bleeding is controlled at this time. Pt c/o pain to both wrists and right knee. Pt denies loc. Pt a&o x 4 during triage.

## 2021-03-20 NOTE — ED Notes (Signed)
Pt told registration she was leaving the facility due to wait time.

## 2021-04-06 ENCOUNTER — Other Ambulatory Visit: Payer: Self-pay | Admitting: Adult Health

## 2021-04-29 ENCOUNTER — Other Ambulatory Visit: Payer: Self-pay

## 2021-04-29 ENCOUNTER — Ambulatory Visit
Admission: RE | Admit: 2021-04-29 | Discharge: 2021-04-29 | Disposition: A | Discharge status: Home / Self Care | Payer: Federal, State, Local not specified - PPO | Source: Ambulatory Visit | Attending: Internal Medicine | Admitting: Internal Medicine

## 2021-04-29 DIAGNOSIS — Z1231 Encounter for screening mammogram for malignant neoplasm of breast: Secondary | ICD-10-CM

## 2021-05-19 ENCOUNTER — Other Ambulatory Visit: Payer: Self-pay | Admitting: Adult Health

## 2021-06-11 ENCOUNTER — Other Ambulatory Visit: Payer: Self-pay | Admitting: Internal Medicine

## 2021-06-17 ENCOUNTER — Other Ambulatory Visit: Payer: Self-pay

## 2021-06-17 MED ORDER — LOSARTAN POTASSIUM 25 MG PO TABS: 25.0000 mg | ORAL_TABLET | Freq: Every day | ORAL | 0 refills | Status: DC

## 2021-06-19 ENCOUNTER — Telehealth: Payer: Self-pay | Admitting: Internal Medicine

## 2021-06-19 MED ORDER — LOSARTAN POTASSIUM 25 MG PO TABS: 25.0000 mg | ORAL_TABLET | Freq: Every day | ORAL | 0 refills | Status: DC

## 2021-06-19 NOTE — Telephone Encounter (Signed)
Rx(s) sent to pharmacy electronically. Appointment scheduled for 07/23/21

## 2021-06-19 NOTE — Telephone Encounter (Signed)
*  STAT* If patient is at the pharmacy, call can be transferred to refill team.   1. Which medications need to be refilled? (please list name of each medication and dose if known) losartan (COZAAR) 25 MG tablet  2. Which pharmacy/location (including street and city if local pharmacy) is medication to be sent to? [id number] - [place] - [address]  3. Do they need a 30 day or 90 day supply? 90

## 2021-07-21 NOTE — Progress Notes (Addendum)
Cardiology Clinic Note   Patient Name: Sandra Bruce Date of Encounter: 07/23/2021  Primary Care Provider:  Domingo Sep, MD Primary Cardiologist:  Parke Poisson, MD  Patient Profile    Sandra Bruce presents the clinic today for follow-up evaluation of her hypertension and chest discomfort.   Past Medical History    Past Medical History:  Diagnosis Date   Chest pain 08/11/2014   Hypertension    Past Surgical History:  Procedure Laterality Date   AUGMENTATION MAMMAPLASTY Bilateral    CESAREAN SECTION     NASAL SINUS SURGERY      Allergies  Allergies  Allergen Reactions   Codeine    Hydrocodone Other (See Comments)    Hyperactivity   Wound Dressing Adhesive Rash    History of Present Illness    Sandra Bruce has a PMH of essential hypertension, hypothyroidism, atypical chest discomfort, and anxiety.  She was seen and evaluated by Dr. Royann Shivers in 2015.  At that time it was felt that her chest discomfort was atypical.  She had a stress test and an EGD that were unremarkable.  She was seen by [facility] 02/28/2020.  During that time she was placed on losartan and as needed hydralazine for blood pressure greater than 160/100.  She kept a blood pressure log.  She reported that her blood pressure remained elevated for the first 2 days and then came down into the 130 systolic range.  She reported blood pressures as low as 117-110 systolic.  She had reported a couple of which she called weak spells.  However, this was not related to her blood pressure.  She did note a weak spell with her blood pressure being 110/70 and also reported a weak spell with her blood pressure being 161/77.  She reported taking only 4 doses of hydralazine over a 2-week period.  She followed up with Joni Reining, DNP on 03/13/2020.  During that time she reported that she felt fine except for brief periods of weak episodes.  She denied rapid heart rate, syncope, presyncope since starting her losartan  medication.  Her repeat labs showed sodium 137, potassium 4.3, chloride 102, creatinine 0.85  She presents the clinic today for follow-up evaluation states she feels well.  She notes periods of weak spells a couple times a week.  This seems to be her baseline.  She relates this to labile blood pressure/getting up too fast.  We reviewed her previous echocardiogram and stress test.  She expressed understanding.  She stays well-hydrated drinking more than 64 ounces per day.  She reports that she eats a low-sodium diet.  She had labs drawn with her PCP which we will request.  I have asked her to monitor her blood pressure 2 times per week.  We will continue her current medication regimen and plan follow-up for 1 year.  Today she denies chest pain, shortness of breath, lower extremity edema, fatigue, palpitations, melena, hematuria, hemoptysis, diaphoresis, weakness, presyncope, syncope, orthopnea, and PND.   Home Medications    Prior to Admission medications   Medication Sig Start Date End Date Taking? Authorizing Provider  ALPRAZolam (XANAX PO) Take 0.25 mg by mouth at bedtime as needed.     [provider]  Cyanocobalamin 5000 MCG SUBL Place 1 mg under the tongue daily.    [provider]  CYANOCOBALAMIN PO Place 1 tablet under the tongue daily.    [provider]  estrogens, conjugated, (PREMARIN) 0.625 MG tablet Take 0.625 mg by  mouth daily. Take daily for 21 days then do not take for 7 days.    [provider]  famotidine (PEPCID) 40 MG tablet Take 40 mg by mouth 2 (two) times daily.    [provider]  hydrALAZINE (APRESOLINE) 25 MG tablet Take  25 mg tablet twice a day if needed for blood pressure greater than 160/100 02/28/20   Elouise Munroe, MD  levothyroxine (SYNTHROID) 25 MCG tablet Take 1 tablet by mouth daily. 02/01/20   [provider]  losartan (COZAAR) 25 MG tablet Take 1 tablet (25 mg total) by mouth daily. 06/19/21   Elouise Munroe, MD  mirtazapine (REMERON) 15 MG tablet Take 15 mg by mouth at bedtime. 02/09/20   [provider]  montelukast (SINGULAIR) 10 MG tablet Take 1 tablet by mouth as needed. 05/04/16   [provider]  Multiple Vitamin (MULTI-VITAMIN) tablet Take 1 tablet by mouth daily.    [provider]  omeprazole (PRILOSEC) 40 MG capsule Take 40 mg by mouth daily. 02/01/20   [provider]    Family History    Family History  Problem Relation Age of Onset   Heart attack Father    Breast cancer Neg Hx    She indicated that the status of her father is unknown. She indicated that the status of her neg hx is unknown.  Social History    Social History   Socioeconomic History   Marital status: Divorced    Spouse name: Not on file   Number of children: Not on file   Years of education: Not on file   Highest education level: Not on file  Occupational History   Not on file  Tobacco Use   Smoking status: Never   Smokeless tobacco: Never  Substance and Sexual Activity   Alcohol use: Yes    Alcohol/week: 4.0 standard drinks    Types: 4 Glasses of wine per week   Drug use: Never   Sexual activity: Not on file  Other Topics Concern   Not on file  Social History Narrative   Not on file   Social Determinants of Health   Financial Resource Strain: Not on file  Food Insecurity: Not on file  Transportation Needs: Not on file  Physical Activity: Not on file  Stress: Not on file  Social Connections: Not on file  Intimate Partner Violence: Not on file     Review of Systems    General:  No chills, fever, night sweats or weight changes.  Cardiovascular:  No chest pain, dyspnea on exertion, edema, orthopnea, palpitations, paroxysmal nocturnal dyspnea. Dermatological: No rash, lesions/masses Respiratory: No cough, dyspnea Urologic: No hematuria, dysuria Abdominal:   No nausea, vomiting, diarrhea, bright red blood per rectum, melena, or  hematemesis Neurologic:  No visual changes, wkns, changes in mental status. All other systems reviewed and are otherwise negative except as noted above.  Physical Exam    VS:  BP (!) 142/80   Pulse 69   Ht 5' 0.5" (1.537 m)   Wt 130 lb 3.2 oz (59.1 kg)   SpO2 97%   BMI 25.01 kg/m  , BMI Body mass index is 25.01 kg/m. GEN: Well nourished, well developed, in no acute distress. HEENT: normal. Neck: Supple, no JVD, carotid bruits, or masses. Cardiac: RRR, no murmurs, rubs, or gallops. No clubbing, cyanosis, edema.  Radials/DP/PT 2+ and equal bilaterally.  Respiratory:  Respirations regular and unlabored, clear to auscultation bilaterally. GI: Soft, nontender, nondistended, BS +  x 4. MS: no deformity or atrophy. Skin: warm and dry, no rash. Neuro:  Strength and sensation are intact. Psych: Normal affect.  Accessory Clinical Findings    Recent Labs: No results found for requested labs within last 8760 hours.   Recent Lipid Panel No results found for: CHOL, TRIG, HDL, CHOLHDL, VLDL, LDLCALC, LDLDIRECT  ECG personally reviewed by me today-normal sinus rhythm 69 bpm- No acute changes  Echocardiogram 04/03/2020 IMPRESSIONS     1. Left ventricular ejection fraction, by estimation, is 60 to 65%. The  left ventricle has normal function. The left ventricle has no regional  wall motion abnormalities. Left ventricular diastolic parameters are  consistent with Grade I diastolic  dysfunction (impaired relaxation).   2. Right ventricular systolic function is normal. The right ventricular  size is normal. There is normal pulmonary artery systolic pressure.   3. The mitral valve is normal in structure. No evidence of mitral valve  regurgitation. No evidence of mitral stenosis.   4. The aortic valve is normal in structure. Aortic valve regurgitation is  not visualized. Mild aortic valve sclerosis is present, with no evidence  of aortic valve stenosis.   5. The inferior vena cava is normal  in size with greater than 50%  respiratory variability, suggesting right atrial pressure of 3 mmHg.   Assessment & Plan   1.  Essential hypertension-BP today 142/80.  Continues to be labile at home.  Previously labile blood pressures.  Unable to tolerate clonidine due to diarrhea.  Unable to tolerate Benicar for the same reasons.  Previously reported 8 months before episodes of intolerance appear.  Has sensation of diarrhea when medication previously stopped.  Follow-up echocardiogram showed normal LVEF, G1 DD, mild aortic valve sclerosis and no other significant valvular abnormalities. Continue losartan, hydralazine as needed Heart healthy low-sodium diet-salty 6 given Increase physical activity as tolerated  Chest discomfort-no recent episodes of arm neck back or chest discomfort.  Previous stress testing unremarkable.  She is somewhat physically active walking and doing chores around the house.  Denies exertional chest pain for No plans for ischemic evaluation at this time Continue to monitor.  Hypothyroidism-on medical therapy.   Continue levothyroxine Follows with PCP  Disposition: Follow-up with Dr. Margaretann Loveless or me in 12 months.  Jossie Ng. Margy Sumler NP-C    07/23/2021, 9:55 AM [address] Office [phone] Fax [phone]  Notice: This dictation was prepared with Dragon dictation along with smaller phrase technology. Any transcriptional errors that result from this process are unintentional and may not be corrected upon review.  I spent 13 minutes examining this patient, reviewing medications, and using patient centered shared decision making involving her cardiac care.  Prior to her visit I spent greater than 20 minutes reviewing her past medical history,  medications, and prior cardiac tests.

## 2021-07-23 ENCOUNTER — Ambulatory Visit (INDEPENDENT_AMBULATORY_CARE_PROVIDER_SITE_OTHER): Payer: Federal, State, Local not specified - PPO | Admitting: General Practice

## 2021-07-23 ENCOUNTER — Encounter: Payer: Self-pay | Admitting: General Practice

## 2021-07-23 ENCOUNTER — Other Ambulatory Visit: Payer: Self-pay

## 2021-07-23 VITALS — BP 142/80 | HR 69 | Ht 60.5 in | Wt 130.2 lb

## 2021-07-23 DIAGNOSIS — I10 Essential (primary) hypertension: Secondary | ICD-10-CM

## 2021-07-23 DIAGNOSIS — R0789 Other chest pain: Secondary | ICD-10-CM | POA: Diagnosis not present

## 2021-07-23 DIAGNOSIS — E039 Hypothyroidism, unspecified: Secondary | ICD-10-CM | POA: Diagnosis not present

## 2021-07-23 NOTE — Patient Instructions (Addendum)
Medication Instructions:  The current medical regimen is effective;  continue present plan and medications as directed. Please refer to the Current Medication list given to you today.   *If you need a refill on your cardiac medications before your next appointment, please call your pharmacy*  Lab Work:   Testing/Procedures:  NONE    NONE  Special Instructions PLEASE READ AND FOLLOW SALTY 6-ATTACHED-1,800mg  daily  PLEASE CONTINUE PHYSICAL ACTIVITY AS TOLERATED   TAKE AND LOG YOUR BLOOD PRESSURE TWICE WEEKLY AND IF YOU HAVE ANY SYMPTOMS.  MAINTAIN YOUR HYDRATION  Follow-Up: Your next appointment:  12 month(s) In Person with Parke Poisson, MD  or Edd Fabian, FNP, Micah Flesher, PA-C, Marjie Skiff, PA-C, Juanda Crumble, PA-C, Joni Reining, DNP, ANP, Azalee Course, PA-C, or Bernadene Person, NP      Please call our office 2 months in advance to schedule this appointment   At [facility], you and your health needs are our priority.  As part of our continuing mission to provide you with exceptional heart care, we have created designated Provider Care Teams.  These Care Teams include your primary Cardiologist (physician) and Advanced Practice Providers (APPs -  Physician Assistants and Nurse Practitioners) who all work together to provide you with the care you need, when you need it.  We recommend signing up for the patient portal called "MyChart".  Sign up information is provided on this After Visit Summary.  MyChart is used to connect with patients for Virtual Visits (Telemedicine).  Patients are able to view lab/test results, encounter notes, upcoming appointments, etc.  Non-urgent messages can be sent to your provider as well.   To learn more about what you can do with MyChart, go to [email or web address].              6 SALTY THINGS TO AVOID     1,800MG  DAILY

## 2021-09-08 ENCOUNTER — Other Ambulatory Visit: Payer: Self-pay | Admitting: Internal Medicine

## 2021-09-15 ENCOUNTER — Telehealth: Payer: Self-pay | Admitting: Internal Medicine

## 2021-09-15 NOTE — Telephone Encounter (Signed)
°*  STAT* If patient is at the pharmacy, call can be transferred to refill team.   1. Which medications need to be refilled? (please list name of each medication and dose if known)  losartan (COZAAR) 25 MG tablet  2. Which pharmacy/location (including street and city if local pharmacy) is medication to be sent to? [facility] [place], [place] - [address]  3. Do they need a 30 day or 90 day supply? 90 with refills  Patient was seen 07/23/21 . She normally gets all of her medications for a full year. This last fill was only for 90 days. She would like a full year supply

## 2021-09-16 MED ORDER — LOSARTAN POTASSIUM 25 MG PO TABS: 25.0000 mg | ORAL_TABLET | Freq: Every day | ORAL | 3 refills | Status: DC

## 2021-09-16 NOTE — Telephone Encounter (Signed)
Sent refills to the pharmacy with enough to last patient until next annual appointment.

## 2022-03-19 IMAGING — DX DG WRIST COMPLETE 3+V*L*
4 series · 4 of 4 positions shown · non-contrast
Comparison: None.

CLINICAL DATA: Fall

EXAM:
LEFT WRIST - COMPLETE 3+ VIEW

[wrist ap]
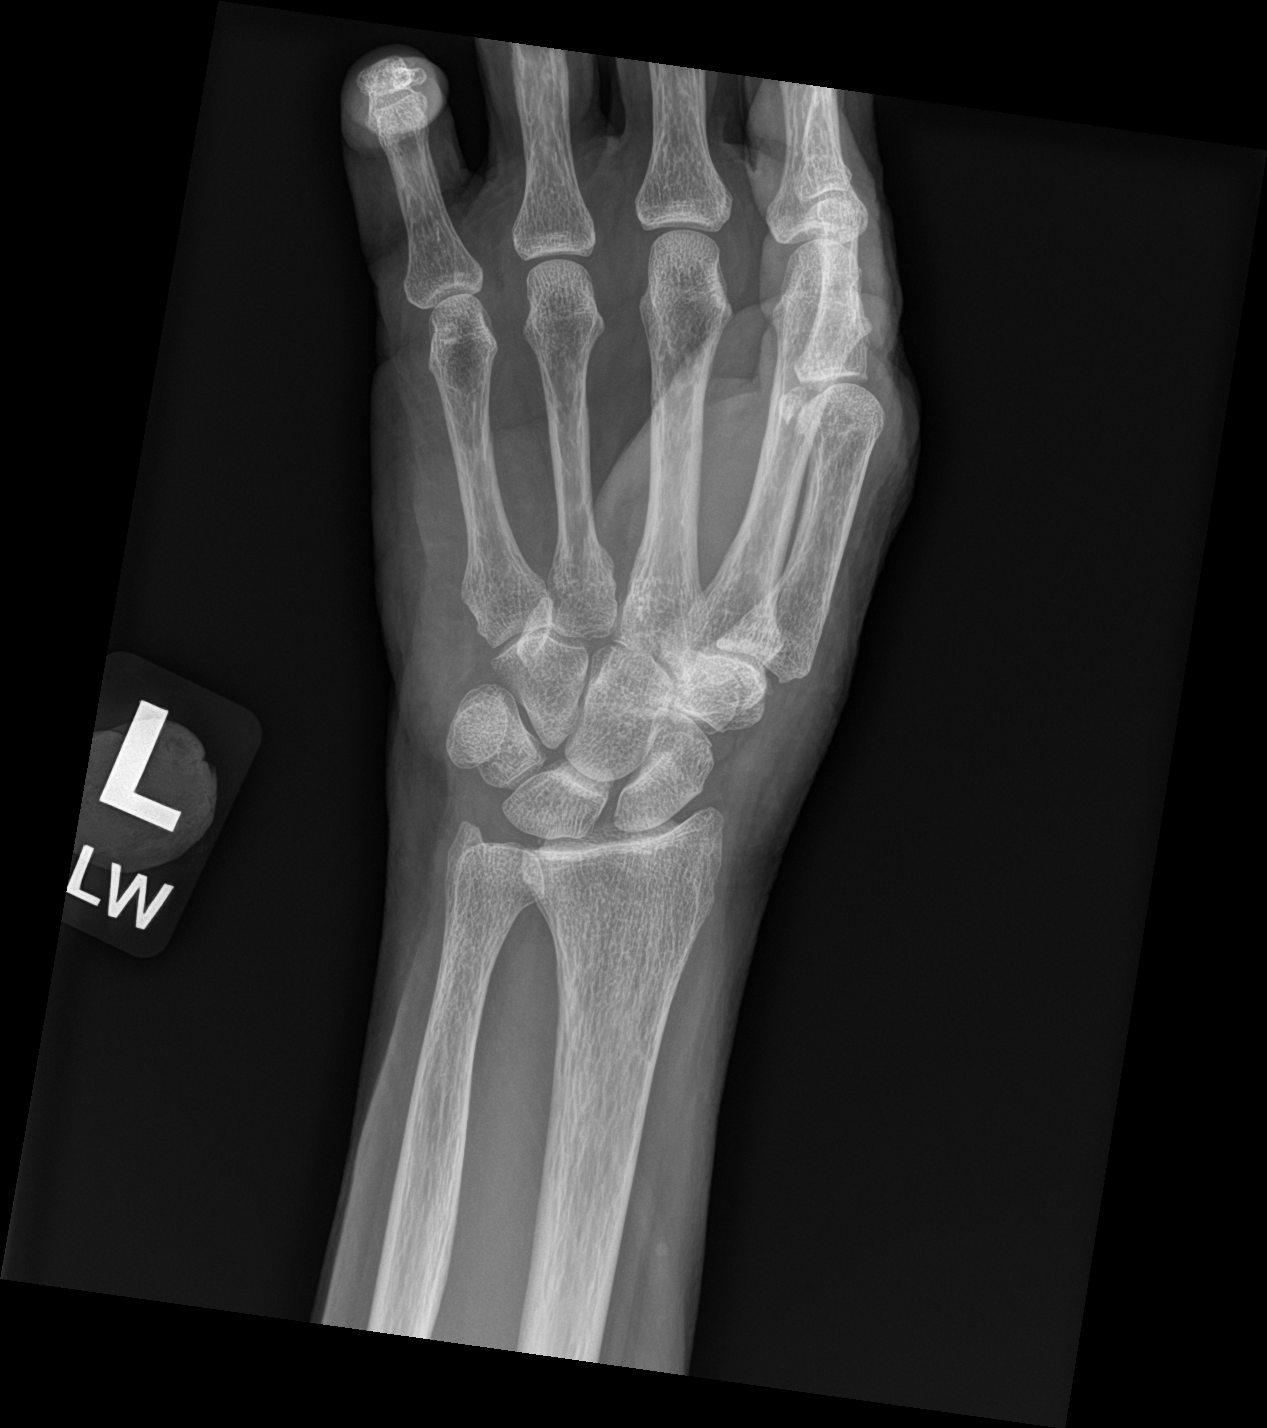

[wrist obl]
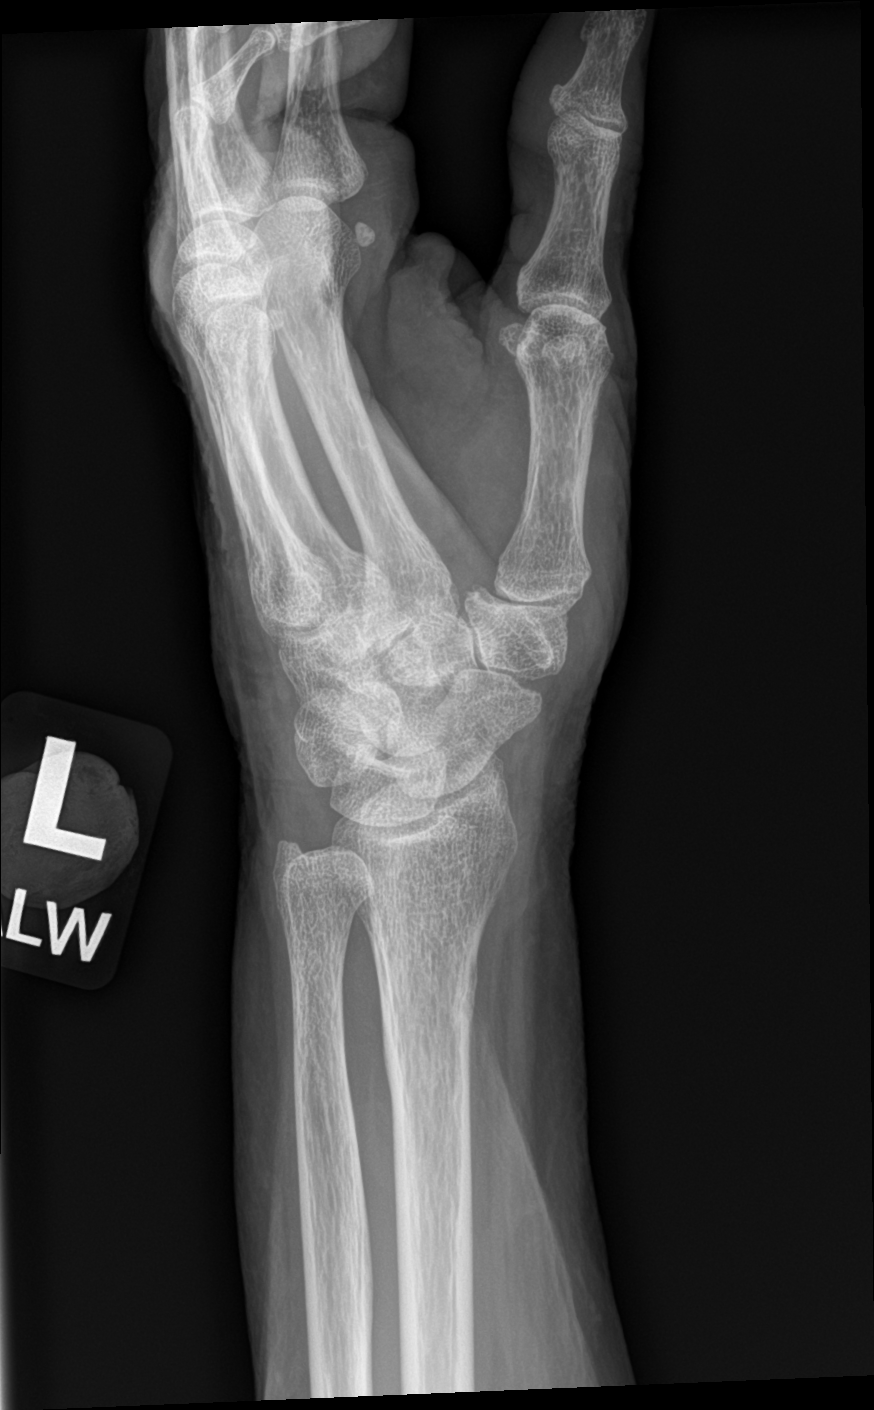

[wrist lat]
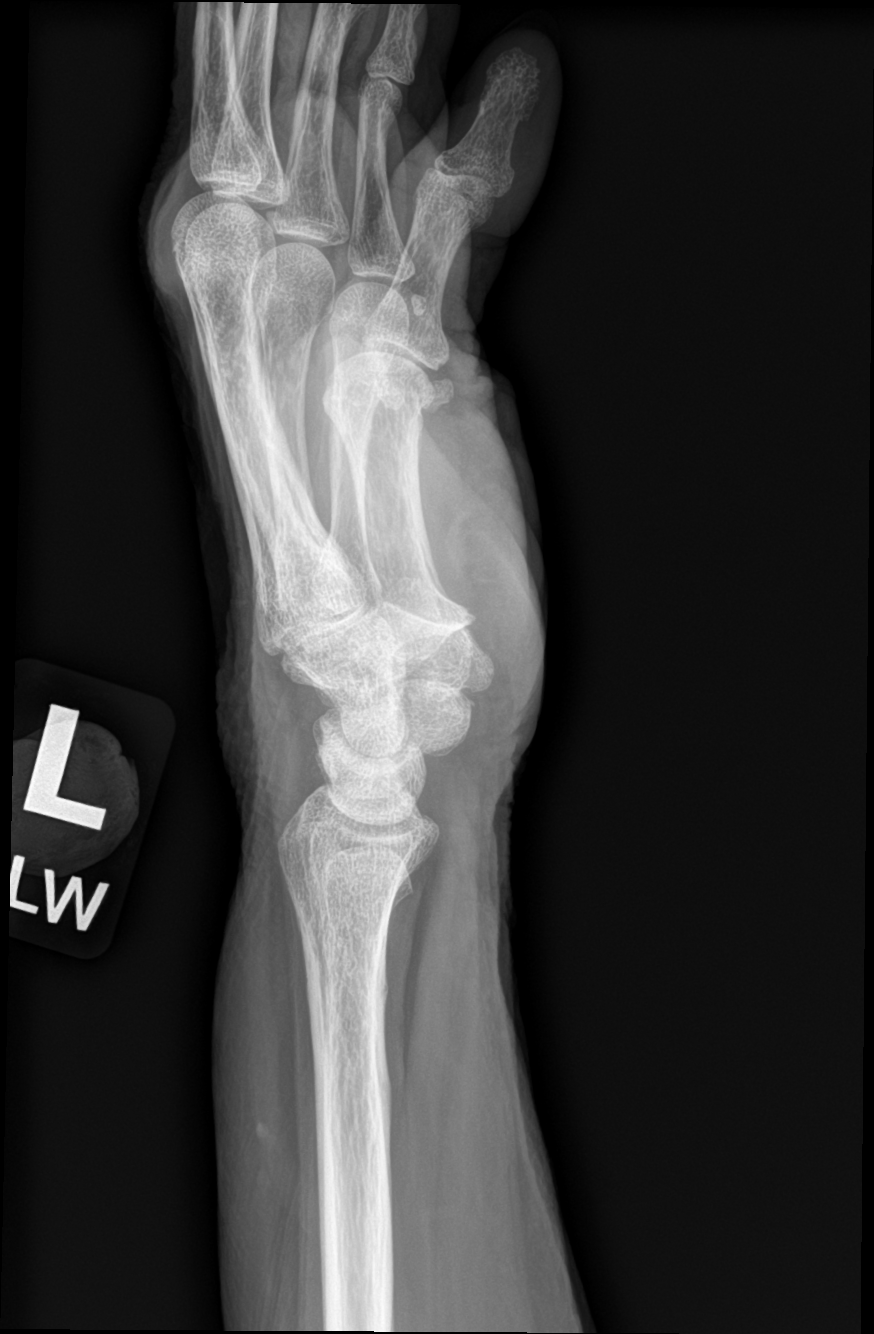

[wrist navicular]
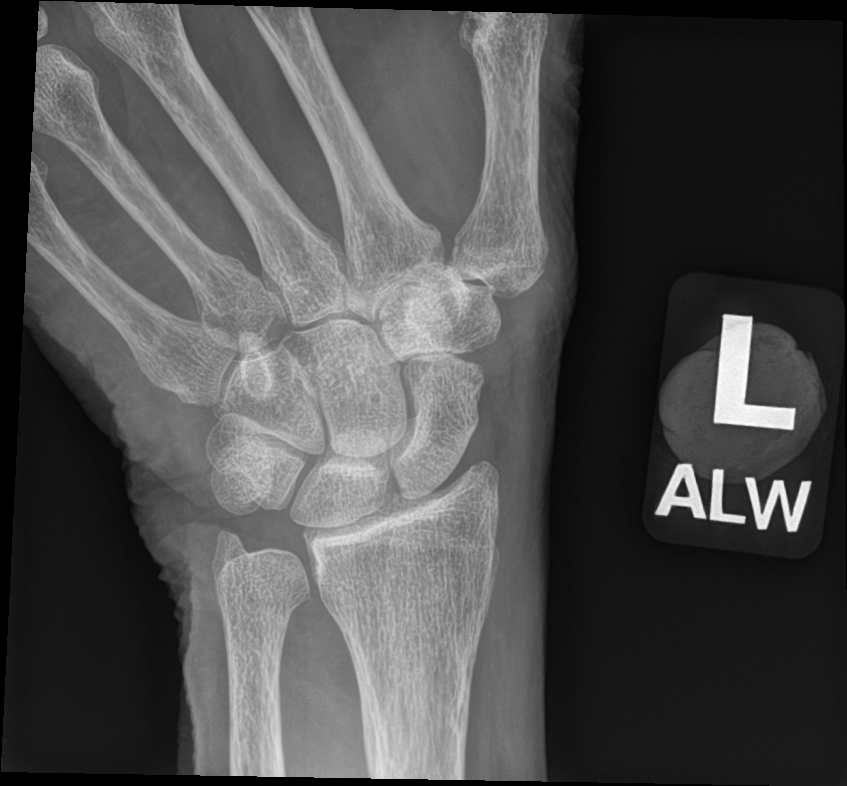

[4 of 4 positions shown; findings below may reference images not displayed]

FINDINGS: There is no evidence of fracture or dislocation. There is no
evidence of arthropathy or other focal bone abnormality. Soft
tissues are unremarkable.
IMPRESSION: Negative.

## 2022-09-14 ENCOUNTER — Encounter: Payer: Self-pay | Admitting: *Deleted

## 2022-09-14 ENCOUNTER — Ambulatory Visit: Payer: Federal, State, Local not specified - PPO | Attending: Internal Medicine | Admitting: Internal Medicine

## 2022-09-14 VITALS — BP 142/84 | HR 76 | Ht 60.0 in | Wt 127.4 lb

## 2022-09-14 DIAGNOSIS — R42 Dizziness and giddiness: Secondary | ICD-10-CM | POA: Diagnosis not present

## 2022-09-14 DIAGNOSIS — I10 Essential (primary) hypertension: Secondary | ICD-10-CM | POA: Diagnosis not present

## 2022-09-14 DIAGNOSIS — R55 Syncope and collapse: Secondary | ICD-10-CM | POA: Diagnosis not present

## 2022-09-14 NOTE — Progress Notes (Signed)
Cardiology Office Note:    Date:  09/14/22   ID:  Sandra Bruce, [place] 1946/06/29, MRN [id number]  PCP:  Heber Hilmar-Irwin, MD  Cardiologist:  Elouise Munroe, MD  Electrophysiologist:  None   Referring MD: Heber Alderson, MD   Chief Complaint: syncope, HTN  History of Present Illness:    Sandra Bruce is a 77 y.o. female with a history of HTN, HLD, depression, insomnia. She has had very difficult to control HTN and has worked with her PCP Dr. Barbee Shropshire in the past to optimize her regimen. She presents for evaluation of presyncopal episodes and HTN management.   Patient was last seen by me on 02/28/20 and reported two episodes of syncope. During that time she was placed on losartan and as needed hydralazine for blood pressure greater than 160/100 mmHg.   Seen by APPs in interim.  Intolerances: Olmesartan - diarrhea which was significant Atenolol - nausea Chlorthalidone - nausea Toprol XL - dizziness  Other: Labetalol - ineffective.  She was last seen by Deberah Pelton, NP on 07/23/21 and reported periods of weak spells a couple times a week. This seemed to be her baseline. She related this to labile blood pressure/getting up too fast.   Today, she presents her BP log. Some BP readings are abnormal. She is compliant with Losartan 25 mg. When she feels weak, her BP is still elevated. She has not tried to increase her losartan dose. She is having more frequent weak spells this year. Weak spells were present before starting losartan. She has taken Hydralazine 3 times in the last year for elevated BP.  She describes near-syncope episodes at least 3-5 times a week.  The only medication changes she's had in the last year was amitriptyline. Her amitriptyline has resolved her diarrhea and improved her LE bilateral edema. She reports that it has not affected the frequency of weak spells. Symptoms more frequent with holding extra weight in her hand and going uphill. She reports a guarantee of needing  to pause if climbing stairs while holding tray. She says she will usually be okay when walking straight. She also reports one episode of weak spells while standing and cutting vegetables. This does not occur with prolonged standing or position change. These symptoms have significantly affected her life. She describes her symptoms as a curtain falling in her vision. Denies room spinning sensations. Patient occasionally wears compressions socks and did not feel like they helped. Does not think she will tolerate an abdominal binder due to hernia.   Cholesterol normal. Trig elevated. She takes 5 MG rosuvastatin. She also reduced meat and dairy intake. She regularly stays hydrated. No valve disease on last echo.   She previously saw a neurologist for these symptoms. She occasionally wears compression socks and repots no difference in symptoms.She denies any palpitations, chest pain, or shortness of breath. No headaches, orthopnea, or PND. Her Father had a MI at 26 and had a history of high cholesterol.   Past Medical History:  Diagnosis Date   Chest pain 08/11/2014   Hypertension     Past Surgical History:  Procedure Laterality Date   AUGMENTATION MAMMAPLASTY Bilateral    CESAREAN SECTION     NASAL SINUS SURGERY      Current Medications: Current Meds  Medication Sig   ALPRAZolam (XANAX PO) Take 0.25 mg by mouth at bedtime as needed.    amitriptyline (ELAVIL) 25 MG tablet Take 25 mg by mouth at bedtime.   Cyanocobalamin 5000 MCG  SUBL Place 1 mg under the tongue daily.   CYANOCOBALAMIN PO Place 1 tablet under the tongue daily.   estrogens, conjugated, (PREMARIN) 0.625 MG tablet Take 0.625 mg by mouth daily. Take daily for 21 days then do not take for 7 days.   famotidine (PEPCID) 40 MG tablet Take 40 mg by mouth 2 (two) times daily.   hydrALAZINE (APRESOLINE) 25 MG tablet Take  25 mg tablet twice a day if needed for blood pressure greater than 160/100   levothyroxine (SYNTHROID) 25 MCG tablet  Take 1 tablet by mouth daily.   losartan (COZAAR) 25 MG tablet Take 1 tablet (25 mg total) by mouth daily.   montelukast (SINGULAIR) 10 MG tablet Take 1 tablet by mouth as needed.   Multiple Vitamin (MULTI-VITAMIN) tablet Take 1 tablet by mouth daily.   omeprazole (PRILOSEC) 40 MG capsule Take 40 mg by mouth daily.     Allergies:   Codeine, Hydrocodone, and Wound dressing adhesive   Social History   Socioeconomic History   Marital status: Divorced    Spouse name: Not on file   Number of children: Not on file   Years of education: Not on file   Highest education level: Not on file  Occupational History   Not on file  Tobacco Use   Smoking status: Never   Smokeless tobacco: Never  Substance and Sexual Activity   Alcohol use: Yes    Alcohol/week: 4.0 standard drinks of alcohol    Types: 4 Glasses of wine per week   Drug use: Never   Sexual activity: Not on file  Other Topics Concern   Not on file  Social History Narrative   Not on file   Social Determinants of Health   Financial Resource Strain: Not on file  Food Insecurity: Not on file  Transportation Needs: Not on file  Physical Activity: Not on file  Stress: Not on file  Social Connections: Not on file     Family History: The patient's family history includes Heart attack in her father. There is no history of Breast cancer.  ROS:   Please see the history of present illness.    (+)weak spells All other systems reviewed and are negative.  EKGs/Labs/Other Studies Reviewed:    The following studies were reviewed today:  EKG:  EKG is personally reviewed. 09/14/22: Sinus rhythm. Nonspecific ST abnormality. 07/23/21: normal sinus rhythm 69 bpm- No acute changes Molli Hazard, Thomasene Ripple, NP) 02/28/20: NSR, rate 64.  ECHO 04/03/20: IMPRESSIONS     1. Left ventricular ejection fraction, by estimation, is 60 to 65%. The  left ventricle has normal function. The left ventricle has no regional  wall motion abnormalities. Left  ventricular diastolic parameters are  consistent with Grade I diastolic  dysfunction (impaired relaxation).   2. Right ventricular systolic function is normal. The right ventricular  size is normal. There is normal pulmonary artery systolic pressure.   3. The mitral valve is normal in structure. No evidence of mitral valve  regurgitation. No evidence of mitral stenosis.   4. The aortic valve is normal in structure. Aortic valve regurgitation is  not visualized. Mild aortic valve sclerosis is present, with no evidence  of aortic valve stenosis.   5. The inferior vena cava is normal in size with greater than 50%  respiratory variability, suggesting right atrial pressure of 3 mmHg.    Recent Labs: No results found for requested labs within last 365 days.  Recent Lipid Panel No results found for: "CHOL", "  TRIG", "HDL", "CHOLHDL", "VLDL", "LDLCALC", "LDLDIRECT"  Physical Exam:    VS:  BP (!) 142/84   Pulse 76   Ht 5' (1.524 m)   Wt 127 lb 6.4 oz (57.8 kg)   BMI 24.88 kg/m     Wt Readings from Last 5 Encounters:  09/14/22 127 lb 6.4 oz (57.8 kg)  07/23/21 130 lb 3.2 oz (59.1 kg)  03/13/20 123 lb (55.8 kg)  02/28/20 121 lb (54.9 kg)  08/09/14 110 lb (49.9 kg)     Constitutional: No acute distress Eyes: sclera non-icteric, normal conjunctiva and lids ENMT: normal dentition, moist mucous membranes Cardiovascular: regular rhythm, normal rate, no murmurs. S1 and S2 normal. No jugular venous distention.  Respiratory: clear to auscultation bilaterally GI : normal bowel sounds, soft and nontender. No distention.   MSK: extremities warm, well perfused. No edema.  NEURO: grossly nonfocal exam, moves all extremities. PSYCH: alert and oriented x 3, normal mood and affect.   ASSESSMENT:    1. Pre-syncope   2. Lightheadedness   3. Essential hypertension    PLAN:    Essential hypertension -  - tolerating losartan 25 mg daily. prn hydralazine for BP >160/100, only a few times a year  required.  Syncope and collapse  Recent presyncope -She tells me that her prior monitor was actually an ambulatory blood pressure monitor and not an event monitor.  We will therefore obtain a live monitor to screen for arrhythmia in the setting of repeated infrequent presyncopal episodes.  History of syncope as well.  EKG without evidence of conduction system disease. -Repeat echocardiogram for the same. - Patient is requesting a carotid artery ultrasound.  We discussed the unlikely contribution to her presenting symptoms but reasonable to screen.  HLD -Lipids have improved over time, LDL 85 with dietary changes Crestor 5 mg daily.  Continue.  We discussed strategies to reduce mildly elevated triglycerides.   Total time of encounter: 30 minutes total time of encounter, including 20 minutes spent in face-to-face patient care on the date of this encounter. This time includes coordination of care and counseling regarding above mentioned problem list. Remainder of non-face-to-face time involved reviewing chart documents/testing relevant to the patient encounter and documentation in the medical record. I have independently reviewed documentation from referring provider.   Cherlynn Kaiser, MD, [facility]    Follow-up: 4-6 weeks.  Medication Adjustments/Labs and Tests Ordered: Current medicines are reviewed at length with the patient today.  Concerns regarding medicines are outlined above.  Orders Placed This Encounter  Procedures   Cardiac event monitor   EKG 12-Lead   ECHOCARDIOGRAM COMPLETE   VAS US CAROTID   No orders of the defined types were placed in this encounter.    I,Mitra Faeizi,acting as a Education administrator for Elouise Munroe, MD.,have documented all relevant documentation on the behalf of Elouise Munroe, MD,as directed by  Elouise Munroe, MD while in the presence of Elouise Munroe, MD.  I, Elouise Munroe, MD, have reviewed all documentation for  this visit. The documentation on 09/14/22 for the exam, diagnosis, procedures, and orders are all accurate and complete.

## 2022-09-14 NOTE — Patient Instructions (Signed)
Medication Instructions:  No Changes In Medications at this time.  *If you need a refill on your cardiac medications before your next appointment, please call your pharmacy*  Lab Work: None Ordered At This Time.  If you have labs (blood work) drawn today and your tests are completely normal, you will receive your results only by: [place] (if you have MyChart) OR A paper copy in the mail If you have any lab test that is abnormal or we need to change your treatment, we will call you to review the results.  Testing/Procedures: Your physician has requested that you have an echocardiogram. Echocardiography is a painless test that uses sound waves to create images of your heart. It provides your doctor with information about the size and shape of your heart and how well your heart's chambers and valves are working. You may receive an ultrasound enhancing agent through an IV if needed to better visualize your heart during the echo.This procedure takes approximately one hour. There are no restrictions for this procedure. This will take place at the [address], [address]. '  Your physician has requested that you have a carotid duplex. This test is an ultrasound of the carotid arteries in your neck. It looks at blood flow through these arteries that supply the brain with blood. Allow one hour for this exam. There are no restrictions or special instructions.   ZIO AT Long term monitor-Live Telemetry  Your physician has requested you wear a ZIO patch monitor for 14 days.  This is a single patch monitor. [employer] supplies one patch monitor per enrollment. Additional  stickers are not available.  Please do not apply patch if you will be having a Nuclear Stress Test, Echocardiogram, Cardiac CT, MRI,  or Chest Xray during the period you would be wearing the monitor. The patch cannot be worn during  these tests. You cannot remove and re-apply the ZIO AT patch monitor.  Your ZIO patch monitor  will be mailed 3 day USPS to your address on file. It may take 3-5 days to  receive your monitor after you have been enrolled.  Once you have received your monitor, please review the enclosed instructions. Your monitor has  already been registered assigning a specific monitor serial # to you.   Billing and Patient Assistance Program information  Theodore Demark has been supplied with any insurance information on record for billing. [employer] offers a sliding scale Patient Assistance Program for patients without insurance, or whose  insurance does not completely cover the cost of the ZIO patch monitor. You must apply for the  Patient Assistance Program to qualify for the discounted rate. To apply, call [employer] at [phone],  select option 4, select option 2 , ask to apply for the Patient Assistance Program, (you can request an  interpreter if needed). [employer] will ask your household income and how many people are in your  household. [employer] will quote your out-of-pocket cost based on this information. They will also be able  to set up a 12 month interest free payment plan if needed.  Applying the monitor   Shave hair from upper left chest.  Hold the abrader disc by orange tab. Rub the abrader in 40 strokes over left upper chest as indicated in  your monitor instructions.  Clean area with 4 enclosed alcohol pads. Use all pads to ensure the area is cleaned thoroughly. Let  dry.  Apply patch as indicated in monitor instructions. Patch will be placed under collarbone  on left side of  chest with arrow pointing upward.  Rub patch adhesive wings for 2 minutes. Remove the white label marked "1". Remove the white label  marked "2". Rub patch adhesive wings for 2 additional minutes.  While looking in a mirror, press and release button in center of patch. A small green light will flash 3-4  times. This will be your only indicator that the monitor has been turned on.  Do not shower for the first 24 hours.  You may shower after the first 24 hours.  Press the button if you feel a symptom. You will hear a small click. Record Date, Time and Symptom in  the Patient Log.   Starting the Gateway  In your kit there is a Hydrographic surveyor box the size of a cellphone. This is Airline pilot. It transmits all your  recorded data to [facility]. This box must always stay within 10 feet of you. Open the box and push the *  button. There will be a light that blinks orange and then green a few times. When the light stops  blinking, the Gateway is connected to the ZIO patch. Call [employer] at [phone] to confirm your monitor is transmitting.  Returning your monitor  Remove your patch and place it inside the [place]. In the lower half of the Gateway there is a white  bag with prepaid postage on it. Place Gateway in bag and seal. Mail package back to [place] as soon as  possible. Your physician should have your final report approximately 7 days after you have mailed back  your monitor. Call [place] at [phone] if you have questions regarding your ZIO AT  patch monitor. Call them immediately if you see an orange light blinking on your monitor.  If your monitor falls off in less than 4 days, contact our Monitor department at [phone]. If your  monitor becomes loose or falls off after 4 days call [employer] at [phone] for suggestions on  securing your monitor  Follow-Up: At [facility], you and your health needs are our priority.  As part of our continuing mission to provide you with exceptional heart care, we have created designated Provider Care Teams.  These Care Teams include your primary Cardiologist (physician) and Advanced Practice Providers (APPs -  Physician Assistants and Nurse Practitioners) who all work together to provide you with the care you need, when you need it.  Your next appointment:   4-6 week(s)  Provider:   Elouise Munroe, MD

## 2022-09-17 ENCOUNTER — Ambulatory Visit (HOSPITAL_COMMUNITY)
Admission: RE | Admit: 2022-09-17 | Discharge: 2022-09-17 | Disposition: A | Discharge status: Home / Self Care | Payer: Federal, State, Local not specified - PPO | Source: Ambulatory Visit | Attending: Internal Medicine | Admitting: Internal Medicine

## 2022-09-17 DIAGNOSIS — R55 Syncope and collapse: Secondary | ICD-10-CM | POA: Diagnosis not present

## 2022-09-17 DIAGNOSIS — R42 Dizziness and giddiness: Secondary | ICD-10-CM | POA: Diagnosis not present

## 2022-09-18 ENCOUNTER — Ambulatory Visit: Payer: Federal, State, Local not specified - PPO | Attending: Internal Medicine

## 2022-09-18 DIAGNOSIS — R42 Dizziness and giddiness: Secondary | ICD-10-CM

## 2022-09-18 DIAGNOSIS — R55 Syncope and collapse: Secondary | ICD-10-CM | POA: Diagnosis not present

## 2022-10-12 ENCOUNTER — Encounter: Payer: Self-pay | Admitting: *Deleted

## 2022-10-12 ENCOUNTER — Ambulatory Visit (HOSPITAL_COMMUNITY): Payer: Federal, State, Local not specified - PPO | Attending: Internal Medicine

## 2022-10-12 DIAGNOSIS — R42 Dizziness and giddiness: Secondary | ICD-10-CM | POA: Diagnosis not present

## 2022-10-12 DIAGNOSIS — R55 Syncope and collapse: Secondary | ICD-10-CM | POA: Diagnosis present

## 2022-10-12 LAB — ECHOCARDIOGRAM COMPLETE
Area-P 1/2: 4.89 cm2
Est EF: >75
S' Lateral: 1.5 cm

## 2022-10-19 ENCOUNTER — Ambulatory Visit: Payer: Federal, State, Local not specified - PPO | Attending: Internal Medicine | Admitting: Internal Medicine

## 2022-10-19 ENCOUNTER — Encounter: Payer: Self-pay | Admitting: Internal Medicine

## 2022-10-19 VITALS — BP 116/82 | HR 97 | Ht 60.0 in | Wt 129.0 lb

## 2022-10-19 DIAGNOSIS — R42 Dizziness and giddiness: Secondary | ICD-10-CM

## 2022-10-19 DIAGNOSIS — E039 Hypothyroidism, unspecified: Secondary | ICD-10-CM

## 2022-10-19 DIAGNOSIS — R55 Syncope and collapse: Secondary | ICD-10-CM | POA: Diagnosis not present

## 2022-10-19 DIAGNOSIS — I10 Essential (primary) hypertension: Secondary | ICD-10-CM | POA: Diagnosis not present

## 2022-10-19 NOTE — Progress Notes (Signed)
Cardiology Office Note:    Date:  10/19/2022   ID:  Sandra Bruce, [place] 1945/12/05, MRN [id number]  PCP:  Heber Stafford, MD  Cardiologist:  Elouise Munroe, MD  Electrophysiologist:  None   Referring MD: Heber [place], MD   Chief Complaint: syncope, HTN  History of Present Illness:    Sandra Bruce is a 77 y.o. female with a history of HTN, HLD, depression, insomnia. She has had very difficult to control HTN and has worked with her PCP Dr. Barbee Shropshire in the past to optimize her regimen. She presents for evaluation of presyncopal episodes and HTN management.   Patient was last seen by me on 02/28/20 and reported two episodes of syncope. During that time she was placed on losartan and as needed hydralazine for blood pressure greater than 160/100 mmHg.   Seen by APPs in interim.  Intolerances: Olmesartan - diarrhea which was significant Atenolol - nausea Chlorthalidone - nausea Toprol XL - dizziness  Other: Labetalol - ineffective.   10/19/22: 1 week ago - 165/95 - took hydralazine prn  102/56 - presyncopal with mutliple position changes.   Still struggling with symptoms. We reviewed cardiac testing, carotid dopplers grossly normal. No arrhythmia on monitor. Hyperdynamic LV. Discussed adequate hydration to mitigate LV underfilling.   Prior visit: She was last seen by Deberah Pelton, NP on 07/23/21 and reported periods of weak spells a couple times a week. This seemed to be her baseline. She related this to labile blood pressure/getting up too fast.   Last visit she presented her BP log. Some BP readings are abnormal. She is compliant with Losartan 25 mg. When she feels weak, her BP is still elevated. She has not tried to increase her losartan dose. She is having more frequent weak spells this year. Weak spells were present before starting losartan. She has taken Hydralazine 3 times in the last year for elevated BP.  She describes near-syncope episodes at least 3-5 times a week.   The only medication changes she's had in the last year was amitriptyline. Her amitriptyline has resolved her diarrhea and improved her LE bilateral edema. She reports that it has not affected the frequency of weak spells. Symptoms more frequent with holding extra weight in her hand and going uphill. She reports a guarantee of needing to pause if climbing stairs while holding tray. She says she will usually be okay when walking straight. She also reports one episode of weak spells while standing and cutting vegetables. This does not occur with prolonged standing or position change. These symptoms have significantly affected her life. She describes her symptoms as a curtain falling in her vision. Denies room spinning sensations. Patient occasionally wears compressions socks and did not feel like they helped. Does not think she will tolerate an abdominal binder due to hernia.   Cholesterol normal. Trig elevated. She takes 5 MG rosuvastatin. She also reduced meat and dairy intake. She regularly stays hydrated. No valve disease on last echo.   She previously saw a neurologist for these symptoms. She occasionally wears compression socks and repots no difference in symptoms.She denies any palpitations, chest pain, or shortness of breath. No headaches, orthopnea, or PND. Her Father had a MI at 5 and had a history of high cholesterol.   Past Medical History:  Diagnosis Date   Chest pain 08/11/2014   Hypertension     Past Surgical History:  Procedure Laterality Date   AUGMENTATION MAMMAPLASTY Bilateral    CESAREAN SECTION  NASAL SINUS SURGERY      Current Medications: Current Meds  Medication Sig   ALPRAZolam (XANAX PO) Take 0.25 mg by mouth at bedtime as needed.    amitriptyline (ELAVIL) 25 MG tablet Take 25 mg by mouth at bedtime.   Cyanocobalamin 5000 MCG SUBL Place 1 mg under the tongue daily.   estrogens, conjugated, (PREMARIN) 0.625 MG tablet Take 0.625 mg by mouth daily. Take daily for 21  days then do not take for 7 days.   hydrALAZINE (APRESOLINE) 25 MG tablet Take  25 mg tablet twice a day if needed for blood pressure greater than 160/100   levothyroxine (SYNTHROID) 25 MCG tablet Take 1 tablet by mouth daily.   losartan (COZAAR) 25 MG tablet Take 1 tablet (25 mg total) by mouth daily.   montelukast (SINGULAIR) 10 MG tablet Take 1 tablet by mouth as needed.   Multiple Vitamin (MULTI-VITAMIN) tablet Take 1 tablet by mouth daily.   omeprazole (PRILOSEC) 40 MG capsule Take 40 mg by mouth daily.   pravastatin (PRAVACHOL) 20 MG tablet Take by mouth.     Allergies:   Codeine, Hydrocodone, and Wound dressing adhesive   Social History   Socioeconomic History   Marital status: Divorced    Spouse name: Not on file   Number of children: Not on file   Years of education: Not on file   Highest education level: Not on file  Occupational History   Not on file  Tobacco Use   Smoking status: Never   Smokeless tobacco: Never  Substance and Sexual Activity   Alcohol use: Yes    Alcohol/week: 4.0 standard drinks of alcohol    Types: 4 Glasses of wine per week   Drug use: Never   Sexual activity: Not on file  Other Topics Concern   Not on file  Social History Narrative   Not on file   Social Determinants of Health   Financial Resource Strain: Not on file  Food Insecurity: Not on file  Transportation Needs: Not on file  Physical Activity: Not on file  Stress: Not on file  Social Connections: Not on file     Family History: The patient's family history includes Heart attack in her father. There is no history of Breast cancer.  ROS:   Please see the history of present illness.    (+)weak spells All other systems reviewed and are negative.  EKGs/Labs/Other Studies Reviewed:    The following studies were reviewed today:  EKG:  EKG is personally reviewed. N/a   IMPRESSIONS Echo 10/12/22    1. Global longitudinal strain is -21.2% (normal). Left ventricular   ejection fraction, by estimation, is >75%. The left ventricle has  hyperdynamic function. The left ventricle has no regional wall motion  abnormalities. Left ventricular diastolic  parameters are consistent with Grade I diastolic dysfunction (impaired  relaxation).   2. Right ventricular systolic function is normal. The right ventricular  size is normal.   3. The mitral valve is normal in structure. Trivial mitral valve  regurgitation.   4. The aortic valve is tricuspid. Aortic valve regurgitation is not  visualized. Aortic valve sclerosis is present, with no evidence of aortic  valve stenosis.   Comparison(s): The left ventricular function is unchanged.     Recent Labs: No results found for requested labs within last 365 days.  Recent Lipid Panel No results found for: "CHOL", "TRIG", "HDL", "CHOLHDL", "VLDL", "LDLCALC", "LDLDIRECT"  Physical Exam:    VS:  BP 116/82  Pulse 97   Ht 5' (1.524 m)   Wt 129 lb (58.5 kg)   SpO2 96%   BMI 25.19 kg/m     Wt Readings from Last 5 Encounters:  10/19/22 129 lb (58.5 kg)  09/14/22 127 lb 6.4 oz (57.8 kg)  07/23/21 130 lb 3.2 oz (59.1 kg)  03/13/20 123 lb (55.8 kg)  02/28/20 121 lb (54.9 kg)     Constitutional: No acute distress Eyes: sclera non-icteric, normal conjunctiva and lids ENMT: normal dentition, moist mucous membranes Cardiovascular: regular rhythm, normal rate, no murmurs. S1 and S2 normal. No jugular venous distention.  Respiratory: clear to auscultation bilaterally GI : normal bowel sounds, soft and nontender. No distention.   MSK: extremities warm, well perfused. No edema.  NEURO: grossly nonfocal exam, moves all extremities. PSYCH: alert and oriented x 3, normal mood and affect.   ASSESSMENT:    1. Pre-syncope   2. Lightheadedness   3. Essential hypertension   4. Hypothyroidism, unspecified type     PLAN:    Essential hypertension -  - tolerating losartan 25 mg daily. prn hydralazine for BP >160/100,  only a few times a year required. Weak spells do not correlate consistently with hypotensive episodes.   Syncope and collapse  Recent presyncope -cardiovascular testing grossly normal. Hyperdynamic LV function. Recommend hydration.  - wear compression socks.  - consider alternate speciality referral such as neurology.   HLD -Lipids have improved over time, LDL 85 with dietary changes Crestor 5 mg daily.  Continue.  We discussed strategies to reduce mildly elevated triglycerides.   Total time of encounter: 30 minutes total time of encounter, including 20 minutes spent in face-to-face patient care on the date of this encounter. This time includes coordination of care and counseling regarding above mentioned problem list. Remainder of non-face-to-face time involved reviewing chart documents/testing relevant to the patient encounter and documentation in the medical record. I have independently reviewed documentation from referring provider.   Cherlynn Kaiser, MD, [facility]    Follow-up: 4-6 weeks.  Medication Adjustments/Labs and Tests Ordered: Current medicines are reviewed at length with the patient today.  Concerns regarding medicines are outlined above.  No orders of the defined types were placed in this encounter.  No orders of the defined types were placed in this encounter.  Patient Instructions  Medication Instructions:  No Changes In Medications at this time.  *If you need a refill on your cardiac medications before your next appointment, please call your pharmacy*  Lab Work: None Ordered At This Time.  If you have labs (blood work) drawn today and your tests are completely normal, you will receive your results only by: [place] (if you have MyChart) OR A paper copy in the mail If you have any lab test that is abnormal or we need to change your treatment, we will call you to review the results.  Testing/Procedures: None Ordered At This Time.    Follow-Up: At [facility], you and your health needs are our priority.  As part of our continuing mission to provide you with exceptional heart care, we have created designated Provider Care Teams.  These Care Teams include your primary Cardiologist (physician) and Advanced Practice Providers (APPs -  Physician Assistants and Nurse Practitioners) who all work together to provide you with the care you need, when you need it.  Your next appointment:   6 month(s)  Provider:   Elouise Munroe, MD    PLEASE GET  COMPRESSION STOCKINGS- YOU CAN ORDER THESE ON [facility]    PLEASE STAY WELL HYDRATED

## 2022-10-19 NOTE — Patient Instructions (Signed)
Medication Instructions:  No Changes In Medications at this time.  *If you need a refill on your cardiac medications before your next appointment, please call your pharmacy*  Lab Work: None Ordered At This Time.  If you have labs (blood work) drawn today and your tests are completely normal, you will receive your results only by: [place] (if you have MyChart) OR A paper copy in the mail If you have any lab test that is abnormal or we need to change your treatment, we will call you to review the results.  Testing/Procedures: None Ordered At This Time.   Follow-Up: At [facility] you and your health needs are our priority.  As part of our continuing mission to provide you with exceptional heart care, we have created designated Provider Care Teams.  These Care Teams include your primary Cardiologist (physician) and Advanced Practice Providers (APPs -  Physician Assistants and Nurse Practitioners) who all work together to provide you with the care you need, when you need it.  Your next appointment:   6 month(s)  Provider:   Elouise Munroe, MD    PLEASE GET COMPRESSION STOCKINGS- YOU CAN ORDER THESE ON [facility]    PLEASE STAY WELL HYDRATED

## 2023-05-25 ENCOUNTER — Telehealth: Payer: Self-pay | Admitting: Internal Medicine

## 2023-05-25 DIAGNOSIS — R42 Dizziness and giddiness: Secondary | ICD-10-CM

## 2023-05-25 DIAGNOSIS — I10 Essential (primary) hypertension: Secondary | ICD-10-CM

## 2023-05-25 NOTE — Telephone Encounter (Signed)
Spoke with pt who confirms BP readings below.  She states she took an extra Hydralazine last night and another one after her morning readings today at 850am.  Pt states she is following a low sodium diet and taking medications as prescribed.  She denies any additional symptoms.  Pt advised to recheck her BP around 1030-11am and log BP.  Will forward to Dr Jacques Navy and her nurse for review and any further recommendation.

## 2023-05-25 NOTE — Telephone Encounter (Signed)
Pt c/o BP issue: STAT if pt c/o blurred vision, one-sided weakness or slurred speech  1. What are your last 5 BP readings?   200/108 8:30 PM last night 136/73 3:30 AM this morning 160/92 in the left arm this morning 184/104 in the right arm this morning  2. Are you having any other symptoms (ex. Dizziness, headache, blurred vision, passed out)? No symptoms   3. What is your BP issue? Patient is calling because her BP is high. Patient stated when she took her BP last night and noticed it was high, she took an extra Hydralazine 25 MG. Please advise.

## 2023-05-26 NOTE — Telephone Encounter (Signed)
Parke Poisson, MD  You; Alois Cliche, RNYesterday (9:29 AM)    Due for 6 mo recall. Please set up with Sandra Shields NP as first choice to start to establish in Adv HTN clinic, and referral to Dr. Duke Salvia afterward. No other recommendations if she is feeling ok today and continue plan to take PRN Hydralazine - if urgent symptoms of Headache or Chest pain, pls consider ER presentation. If [facility] has no availability, please arrange next available app.   Spoke with pt regarding adv hypertension clinic referral. Pt mentioned that she has blood pressure swings from systolic 90s to 190s. Pt is agreeable to the plan. Referral for advance HTN clinic made and appointment with Sandra Bruce made. Pt verbalizes understanding.

## 2023-06-14 ENCOUNTER — Institutional Professional Consult (permissible substitution) (HOSPITAL_BASED_OUTPATIENT_CLINIC_OR_DEPARTMENT_OTHER): Payer: Federal, State, Local not specified - PPO | Admitting: Family

## 2023-06-14 ENCOUNTER — Telehealth: Payer: Self-pay

## 2023-06-15 NOTE — Telephone Encounter (Signed)
Erroneous encounter

## 2023-06-16 ENCOUNTER — Encounter (HOSPITAL_BASED_OUTPATIENT_CLINIC_OR_DEPARTMENT_OTHER): Payer: Self-pay | Admitting: Cardiovascular Disease

## 2023-06-16 ENCOUNTER — Ambulatory Visit (HOSPITAL_BASED_OUTPATIENT_CLINIC_OR_DEPARTMENT_OTHER): Payer: Federal, State, Local not specified - PPO | Admitting: Cardiovascular Disease

## 2023-06-16 ENCOUNTER — Other Ambulatory Visit (HOSPITAL_BASED_OUTPATIENT_CLINIC_OR_DEPARTMENT_OTHER): Payer: Self-pay | Admitting: Cardiovascular Disease

## 2023-06-16 VITALS — BP 150/80 | HR 85 | Ht 60.0 in | Wt 128.4 lb

## 2023-06-16 DIAGNOSIS — R0989 Other specified symptoms and signs involving the circulatory and respiratory systems: Secondary | ICD-10-CM

## 2023-06-16 DIAGNOSIS — R030 Elevated blood-pressure reading, without diagnosis of hypertension: Secondary | ICD-10-CM

## 2023-06-16 DIAGNOSIS — I10 Essential (primary) hypertension: Secondary | ICD-10-CM | POA: Diagnosis not present

## 2023-06-16 HISTORY — DX: Other specified symptoms and signs involving the circulatory and respiratory systems: R09.89

## 2023-06-16 NOTE — Patient Instructions (Signed)
Medication Instructions:  Your physician recommends that you continue on your current medications as directed. Please refer to the Current Medication list given to you today.    Labwork: RENIN/ALDOSTERONE LEVELS TODAY   24 HOUR URINE FOR CATACHOLAMINES/METANEPHRINES ON A DAY YOU FEEL BAD    Testing/Procedures: 24 HOUR BLOOD PRESSURE MONITOR THE OFFICE WILL CALL YOU TO SCHEDULE   Follow-Up: 1 MONTH WITH PHARM D AT EITHER [facility]    Special Instructions:  MONITOR AND LOG YOUR BLOOD PRESSURE TWICE A DAY. BRING MACHINE AND READINGS TO FOLLOW UP   DASH Eating Plan DASH stands for "Dietary Approaches to Stop Hypertension." The DASH eating plan is a healthy eating plan that has been shown to reduce high blood pressure (hypertension). It may also reduce your risk for type 2 diabetes, heart disease, and stroke. The DASH eating plan may also help with weight loss. What are tips for following this plan?  General guidelines Avoid eating more than 2,300 mg (milligrams) of salt (sodium) a day. If you have hypertension, you may need to reduce your sodium intake to 1,500 mg a day. Limit alcohol intake to no more than 1 drink a day for nonpregnant women and 2 drinks a day for men. One drink equals 12 oz of beer, 5 oz of wine, or 1 oz of hard liquor. Work with your health care provider to maintain a healthy body weight or to lose weight. Ask what an ideal weight is for you. Get at least 30 minutes of exercise that causes your heart to beat faster (aerobic exercise) most days of the week. Activities may include walking, swimming, or biking. Work with your health care provider or diet and nutrition specialist (dietitian) to adjust your eating plan to your individual calorie needs. Reading food labels  Check food labels for the amount of sodium per serving. Choose foods with less than 5 percent of the Daily Value of sodium. Generally, foods with less than 300 mg of sodium per  serving fit into this eating plan. To find whole grains, look for the word "whole" as the first word in the ingredient list. Shopping Buy products labeled as "low-sodium" or "no salt added." Buy fresh foods. Avoid canned foods and premade or frozen meals. Cooking Avoid adding salt when cooking. Use salt-free seasonings or herbs instead of table salt or sea salt. Check with your health care provider or pharmacist before using salt substitutes. Do not fry foods. Cook foods using healthy methods such as baking, boiling, grilling, and broiling instead. Cook with heart-healthy oils, such as olive, canola, soybean, or sunflower oil. Meal planning Eat a balanced diet that includes: 5 or more servings of fruits and vegetables each day. At each meal, try to fill half of your plate with fruits and vegetables. Up to 6-8 servings of whole grains each day. Less than 6 oz of lean meat, poultry, or fish each day. A 3-oz serving of meat is about the same size as a deck of cards. One egg equals 1 oz. 2 servings of low-fat dairy each day. A serving of nuts, seeds, or beans 5 times each week. Heart-healthy fats. Healthy fats called Omega-3 fatty acids are found in foods such as flaxseeds and coldwater fish, like sardines, salmon, and mackerel. Limit how much you eat of the following: Canned or prepackaged foods. Food that is high in trans fat, such as fried foods. Food that is high in saturated fat, such as fatty meat. Sweets, desserts, sugary drinks, and other  foods with added sugar. Full-fat dairy products. Do not salt foods before eating. Try to eat at least 2 vegetarian meals each week. Eat more home-cooked food and less restaurant, buffet, and fast food. When eating at a restaurant, ask that your food be prepared with less salt or no salt, if possible. What foods are recommended? The items listed may not be a complete list. Talk with your dietitian about what dietary choices are best for  you. Grains Whole-grain or whole-wheat bread. Whole-grain or whole-wheat pasta. Brown rice. Sandra Bruce. Bulgur. Whole-grain and low-sodium cereals. Pita bread. Low-fat, low-sodium crackers. Whole-wheat flour tortillas. Vegetables Fresh or frozen vegetables (raw, steamed, roasted, or grilled). Low-sodium or reduced-sodium tomato and vegetable juice. Low-sodium or reduced-sodium tomato sauce and tomato paste. Low-sodium or reduced-sodium canned vegetables. Fruits All fresh, dried, or frozen fruit. Canned fruit in natural juice (without added sugar). Meat and other protein foods Skinless chicken or Sandra Bruce. Ground chicken or Sandra Bruce. Pork with fat trimmed off. Fish and seafood. Egg whites. Dried beans, peas, or lentils. Unsalted nuts, nut butters, and seeds. Unsalted canned beans. Lean cuts of beef with fat trimmed off. Low-sodium, lean deli meat. Dairy Low-fat (1%) or fat-free (skim) milk. Fat-free, low-fat, or reduced-fat cheeses. Nonfat, low-sodium ricotta or cottage cheese. Low-fat or nonfat yogurt. Low-fat, low-sodium cheese. Fats and oils Soft margarine without trans fats. Vegetable oil. Low-fat, reduced-fat, or light mayonnaise and salad dressings (reduced-sodium). Canola, safflower, olive, soybean, and sunflower oils. Avocado. Seasoning and other foods Herbs. Spices. Seasoning mixes without salt. Unsalted popcorn and pretzels. Fat-free sweets. What foods are not recommended? The items listed may not be a complete list. Talk with your dietitian about what dietary choices are best for you. Grains Baked goods made with fat, such as croissants, muffins, or some breads. Dry pasta or rice meal packs. Vegetables Creamed or fried vegetables. Vegetables in a cheese sauce. Regular canned vegetables (not low-sodium or reduced-sodium). Regular canned tomato sauce and paste (not low-sodium or reduced-sodium). Regular tomato and vegetable juice (not low-sodium or reduced-sodium). Sandra Bruce.  Olives. Fruits Canned fruit in a light or heavy syrup. Fried fruit. Fruit in cream or butter sauce. Meat and other protein foods Fatty cuts of meat. Ribs. Fried meat. Sandra Bruce. Sausage. Bologna and other processed lunch meats. Salami. Fatback. Hotdogs. Bratwurst. Salted nuts and seeds. Canned beans with added salt. Canned or smoked fish. Whole eggs or egg yolks. Chicken or Sandra Bruce with skin. Dairy Whole or 2% milk, cream, and half-and-half. Whole or full-fat cream cheese. Whole-fat or sweetened yogurt. Full-fat cheese. Nondairy creamers. Whipped toppings. Processed cheese and cheese spreads. Fats and oils Butter. Stick margarine. Lard. Shortening. Ghee. Bacon fat. Tropical oils, such as coconut, palm kernel, or palm oil. Seasoning and other foods Salted popcorn and pretzels. Onion salt, garlic salt, seasoned salt, table salt, and sea salt. Worcestershire sauce. Tartar sauce. Barbecue sauce. Teriyaki sauce. Soy sauce, including reduced-sodium. Steak sauce. Canned and packaged gravies. Fish sauce. Oyster sauce. Cocktail sauce. Horseradish that you find on the shelf. Ketchup. Mustard. Meat flavorings and tenderizers. Bouillon cubes. Hot sauce and Tabasco sauce. Premade or packaged marinades. Premade or packaged taco seasonings. Relishes. Regular salad dressings. Where to find more information: National Heart, Lung, and Blood Institute: [employer].is American Heart Association: www.heart.org Summary The DASH eating plan is a healthy eating plan that has been shown to reduce high blood pressure (hypertension). It may also reduce your risk for type 2 diabetes, heart disease, and stroke. With the DASH eating plan, you should limit salt (sodium)  intake to 2,300 mg a day. If you have hypertension, you may need to reduce your sodium intake to 1,500 mg a day. When on the DASH eating plan, aim to eat more fresh fruits and vegetables, whole grains, lean proteins, low-fat dairy, and heart-healthy fats. Work with  your health care provider or diet and nutrition specialist (dietitian) to adjust your eating plan to your individual calorie needs. This information is not intended to replace advice given to you by your health care provider. Make sure you discuss any questions you have with your health care provider. Document Released: 07/30/2011 Document Revised: 07/23/2017 Document Reviewed: 08/03/2016 Elsevier Patient Education  2020 [employer].

## 2023-06-16 NOTE — Progress Notes (Signed)
[facility] Initial Assessment:    Date:  06/16/2023   ID:  Sandra Bruce, Sandra Bruce May 26, 1946, MRN [id number]  PCP:  Domingo Sep, MD  Cardiologist:  Parke Poisson, MD  Nephrologist:  Referring MD: Parke Poisson, MD   CC: Hypertension  History of Present Illness:    Sandra Bruce is a 77 y.o. female with a hx of hypertension, hyperlipidemia, and depression, here to establish care in the [facility]. She is a patient of Dr. Weston Brass, initially seen by her 02/2020. She reported two syncopal episodes, one a few years prior and another more recently, both while walking. Prior echo and BP monitor were reportedly unremarkable. She has also struggled with labile blood pressures, and has known intolerances of several antihypertensives. At that visit her blood pressure was 140/90 on clonidine. They switched her clonidine to losartan 25 mg daily as well as prn hydralazine for BP >160/100.   She had carotid dopplers 08/2022 that were near-normal with only minimal plaque. She wore a monitor that showed no evidence of arrhythmias. Her echo 09/2022 revealed LVEF >75%, grade 1 diastolic dysfunction, trivial mitral regurgitation, and aortic valve sclerosis but no stenosis. Previously seen for chest pain with normal ETT in 2016.  She was last seen by Dr. Jacques Navy 10/19/2022. She was still struggling with labile blood pressures and presyncopal episodes. At one time her blood pressure was 102/56 and she was presyncopal with multiple position changes. One week prior her BP was 165/95 and she took her prn hydralazine, which she had only needed a few times per year. It was noted that her weak spells did not correlate consistently with hypotensive episodes.  She called the office 05/25/2023 and reported her blood pressure was elevated to 200/108 the night before, 136/73 at 3:30 AM, and 184/104 (in the right, 160/92 in the left) later that morning. She took an extra  hydralazine that night and in the morning. She also noted that her blood pressure was labile from 90s-190s systolic. She was referred to the [facility].  Today, she confirms struggling with labile blood pressures for 10 years now. Her main associated symptom is weakness, worse during hypotensive episodes. She usually feels better with higher readings such as 130/78. Her weakness/near syncopal episodes have been an issue for 5 years. They may occur daily but not necessarily with low blood pressures. She has had very rare complete syncopal episodes, and no recent loss of consciousness. Additionally she states that her weakness spells may be exacerbated by carrying weight upstairs. She frequently has a spell after preparing her morning tea and carrying it on a tray upstairs. However, she goes up and down the same stairs 14 times a day without significant issues. Yesterday she felt weak while she was in the kitchen and her blood pressure was 122/65. Recently she had presented to urgent care for a cat bite, and her BP was 96/62. Last week her blood pressure was 200/108 when she randomly checked her blood pressure, she may have felt a little weak at the time. In the office, her blood pressure is 134/78 initially. On manual recheck, her blood pressure is 156/84 in her left arm, and 150/80 in her right arm. She takes her levothyroxine and losartan in the morning, everything else at night. She takes her prn hydralazine when her blood pressure is high. She may need it up to 4 times in a week, and then none in the next week. Her LE edema has  improved since stopping the amlodipine. She affirms that she has been stressed and anxious her whole life. For exercise she goes on walks 3 days a week and participates in yoga classes 4 times per week. Of note, she is able to complete a 1 hour yoga class without feeling symptomatic, but if she tries to complete 2.5 hours she needs to leave early due to the weakness  episodes. She mostly prepares meals at home, and orders out a few times a week. She never adds salt and usually checks food labels for the sodium content. In the morning she drinks a couple cups of hot tea. For pain management she usually takes Advil or Aleve, but not very often. No supplements or herbs. She is not aware of any snoring. Doesn't feel well rested every morning but she denies daytime somnolence. Dr. Suzy Bouchard is following her thyroid function. She denies any palpitations, chest pain, shortness of breath, headaches, orthopnea, or PND.  Previous antihypertensives: Olmesartan - significant diarrhea Atenolol - nausea Chlorthalidone - nausea Toprol XL - dizziness Labetalol - ineffective Clonidine - switched to losartan  Past Medical History:  Diagnosis Date   Chest pain 08/11/2014   Hypertension    Labile hypertension 06/16/2023    Past Surgical History:  Procedure Laterality Date   AUGMENTATION MAMMAPLASTY Bilateral    CESAREAN SECTION     NASAL SINUS SURGERY      Current Medications: Current Meds  Medication Sig   ALPRAZolam (XANAX PO) Take 0.25 mg by mouth at bedtime as needed.    amitriptyline (ELAVIL) 25 MG tablet Take 25 mg by mouth at bedtime.   Cyanocobalamin 5000 MCG SUBL Place 1 mg under the tongue daily.   hydrALAZINE (APRESOLINE) 25 MG tablet Take  25 mg tablet twice a day if needed for blood pressure greater than 160/100   levothyroxine (SYNTHROID) 25 MCG tablet Take 1 tablet by mouth daily.   losartan (COZAAR) 25 MG tablet Take 1 tablet (25 mg total) by mouth daily.   montelukast (SINGULAIR) 10 MG tablet Take 1 tablet by mouth as needed.   Multiple Vitamin (MULTI-VITAMIN) tablet Take 1 tablet by mouth daily.   omeprazole (PRILOSEC) 40 MG capsule Take 40 mg by mouth daily.   pravastatin (PRAVACHOL) 20 MG tablet Take by mouth.     Allergies:   Codeine, Hydrocodone, and Wound dressing adhesive   Social History   Socioeconomic History   Marital status:  Divorced    Spouse name: Not on file   Number of children: Not on file   Years of education: Not on file   Highest education level: Not on file  Occupational History   Not on file  Tobacco Use   Smoking status: Never   Smokeless tobacco: Never  Vaping Use   Vaping status: Never Used  Substance and Sexual Activity   Alcohol use: Yes    Alcohol/week: 4.0 standard drinks of alcohol    Types: 4 Glasses of wine per week   Drug use: Never   Sexual activity: Not on file  Other Topics Concern   Not on file  Social History Narrative   Not on file   Social Determinants of Health   Financial Resource Strain: Patient Declined (06/16/2023)   Overall Financial Resource Strain (CARDIA)    Difficulty of Paying Living Expenses: Patient declined  Food Insecurity: No Food Insecurity (06/16/2023)   Hunger Vital Sign    Worried About Running Out of Food in the Last Year: Never true  Ran Out of Food in the Last Year: Never true  Transportation Needs: No Transportation Needs (06/16/2023)   PRAPARE - Administrator, Civil Service (Medical): No    Lack of Transportation (Non-Medical): No  Physical Activity: Sufficiently Active (06/16/2023)   Exercise Vital Sign    Days of Exercise per Week: 4 days    Minutes of Exercise per Session: 50 min  Stress: No Stress Concern Present (06/16/2023)   [employer] of Occupational Health - Occupational Stress Questionnaire    Feeling of Stress : Not at all  Social Connections: Socially Isolated (06/16/2023)   Social Connection and Isolation Panel [NHANES]    Frequency of Communication with Friends and Family: More than three times a week    Frequency of Social Gatherings with Friends and Family: More than three times a week    Attends Religious Services: Never    Database administrator or Organizations: No    Attends Engineer, structural: Never    Marital Status: Divorced     Family History: The patient's family history  includes Heart attack in her father; Hypertension in her mother; Stroke in her mother. There is no history of Breast cancer.  ROS:   Please see the history of present illness.    (+) Generalized weakness (+) Near-syncope All other systems reviewed and are negative.  EKGs/Labs/Other Studies Reviewed:    Echo  10/12/2022:  1. Global longitudinal strain is -21.2% (normal). Left ventricular  ejection fraction, by estimation, is >75%. The left ventricle has  hyperdynamic function. The left ventricle has no regional wall motion  abnormalities. Left ventricular diastolic  parameters are consistent with Grade I diastolic dysfunction (impaired  relaxation).   2. Right ventricular systolic function is normal. The right ventricular  size is normal.   3. The mitral valve is normal in structure. Trivial mitral valve  regurgitation.   4. The aortic valve is tricuspid. Aortic valve regurgitation is not  visualized. Aortic valve sclerosis is present, with no evidence of aortic  valve stenosis.   Comparison(s): The left ventricular function is unchanged.   Monitor 09/2022: Indication: Syncope   Minimum HR (bpm): 52 Maximum HR (bpm): 136   Supraventricular Ectopy: rare <1% SVT: none   Ventricular Ectopy: rare <1% NSVT: none Ventricular Tachycardia: none   Pauses: none AV block: none   Atrial fibrillation: none   Diary events: "tired/fatigued/passed out, chest pain, SOB" correlate with sinus rhythm and sinus tachycardia.    IMPRESSION: No arrhythmia associated with syncope or "passed out" diary events.   Bilateral Carotid Dopplers  09/17/2022: Summary:  Right Carotid: The extracranial vessels were near-normal with only minimal  wall thickening or plaque.   Left Carotid: The extracranial vessels were near-normal with only minimal  wall thickening or plaque.   Vertebrals:  Bilateral vertebral arteries demonstrate antegrade flow.  Subclavians: Normal flow hemodynamics were seen in  bilateral subclavian arteries.   EKG:  EKG is personally reviewed. 06/16/2023: Not ordered.  Recent Labs: No results found for requested labs within last 365 days.   Recent Lipid Panel No results found for: "CHOL", "TRIG", "HDL", "CHOLHDL", "VLDL", "LDLCALC", "LDLDIRECT"  Physical Exam:    VS:  BP (!) 150/80 (BP Location: Right Arm, Patient Position: Sitting, Cuff Size: Normal)   Pulse 85   Ht 5' (1.524 m)   Wt 128 lb 6.4 oz (58.2 kg)   SpO2 98%   BMI 25.08 kg/m  , BMI Body mass index is 25.08 kg/m. GENERAL:  Well appearing HEENT: Pupils equal round and reactive, fundi not visualized, oral mucosa unremarkable NECK:  No jugular venous distention, waveform within normal limits, carotid upstroke brisk and symmetric, no bruits, no thyromegaly LUNGS:  Clear to auscultation bilaterally HEART:  RRR.  PMI not displaced or sustained, S1 and S2 within normal limits, no S3, no S4, no clicks, no rubs, no murmurs ABD:  Flat, positive bowel sounds normal in frequency in pitch, no bruits, no rebound, no guarding, no midline pulsatile mass, no hepatomegaly, no splenomegaly EXT:  2 plus pulses throughout, no edema, no cyanosis, no clubbing SKIN:  No rashes, no nodules NEURO:  Cranial nerves II through XII grossly intact, motor grossly intact throughout PSYCH:  Cognitively intact, oriented to person place and time   ASSESSMENT/PLAN:    # Labile Hypertension: Uncontrolled with reported episodes of high and low blood pressure. Patient experiences weakness during these episodes. Currently on Losartan and Hydralazine as needed.  She has been intolerant to multiple medications. -Order 24-hour urine collection for pheochromocytoma and blood work for hyperaldosteronism to rule out secondary causes of hypertension. -Order 24-hour blood pressure monitoring to assess the extent of blood pressure fluctuations. -Advise patient to track blood pressure twice daily for a month to identify patterns.  #  Syncope History of fainting episode 5 years ago and frequent near syncope episodes associated with weakness. Episodes occur during various activities and are not solely exercise-induced.  Multiple episodes of near syncope.  Her one syncopal episode sounds orthostatic.  Happened after being awakened from sleep by an alarm and getting up.  Encourage hydration.  Consider compression stockings after BP log is reviewed as above.  She has not been orthostatic on prior testing.  Follow-up in about a month to review results and reassess treatment plan.      Screening for Secondary Hypertension:     06/16/2023    8:18 AM  Causes  Drugs/Herbals Screened     - Comments limits salt.  2 teas/day  +NSAIDS rarely  Renovascular HTN Screened     - Comments check renal Dopplers  Sleep Apnea Screened     - Comments no symptoms  Thyroid Disease Screened  Hyperaldosteronism Screened     - Comments check renin and aldosterone  Pheochromocytoma Screened     - Comments check catecholamines and metanephrines  Cushing's Syndrome N/A  Hyperparathyroidism N/A  Coarctation of the Aorta Screened     - Comments BP symmetric  Compliance Screened    Relevant Labs/Studies:    Latest Ref Rng & Units 03/12/2020   11:09 AM 12/08/2017    9:08 AM 07/10/2012   12:50 PM  Basic Labs  Sodium 134 - 144 mmol/L 137  139  141   Potassium 3.5 - 5.2 mmol/L 4.3  4.0  4.2   Creatinine 0.57 - 1.00 mg/dL 2.72  5.36  6.44     Disposition:    FU with APP/PharmD in 1 month for the next 3 months.   FU with Danecia Underdown C. Duke Salvia, MD, [facility] in 4 months.  Medication Adjustments/Labs and Tests Ordered: Current medicines are reviewed at length with the patient today.  Concerns regarding medicines are outlined above.   Orders Placed This Encounter  Procedures   Aldosterone + renin activity w/ ratio   Metanephrines, urine, 24 hour   Catecholamines, fractionated, urine, 24 hour   CAR 24HR BLOOD PRESSURE MONITOR   No orders of the  defined types were placed in this encounter.  I,Mathew Stumpf,acting as a Neurosurgeon  for Chilton Si, MD.,have documented all relevant documentation on the behalf of Chilton Si, MD,as directed by  Chilton Si, MD while in the presence of Chilton Si, MD.  I, Marriah Sanderlin C. Duke Salvia, MD have reviewed all documentation for this visit.  The documentation of the exam, diagnosis, procedures, and orders on 06/16/2023 are all accurate and complete.   Signed, Chilton Si, MD  06/16/2023 8:50 AM    [facility]

## 2023-06-21 LAB — ALDOSTERONE + RENIN ACTIVITY W/ RATIO
Aldos/Renin Ratio: 1.7 (ref 0.0–30.0)
Aldosterone: 5.4 ng/dL (ref 0.0–30.0)
Renin Activity, Plasma: 3.105 ng/mL/h (ref 0.167–5.380)

## 2023-06-25 LAB — METANEPHRINES, URINE, 24 HOUR
Metaneph Total, Ur: <10 ug/L
Metanephrines, 24H Ur: <30 ug/(24.h) — ABNORMAL LOW (ref 36–209)
Normetanephrine, 24H Ur: 252 ug/(24.h) (ref 131–612)
Normetanephrine, Ur: 84 ug/L

## 2023-06-25 LAB — CATECHOLAMINES, FRACTIONATED, URINE, 24 HOUR
Dopamine , 24H Ur: 216 ug/(24.h) (ref 0–510)
Dopamine, Rand Ur: 72 ug/L
Epinephrine, 24H Ur: <9 ug/(24.h) (ref 0–20)
Epinephrine, Rand Ur: <3 ug/L
Norepinephrine, 24H Ur: <45 ug/(24.h) (ref 0–135)
Norepinephrine, Rand Ur: <15 ug/L

## 2023-07-13 NOTE — Progress Notes (Signed)
Office Visit    Patient Name: Sandra Bruce Date of Encounter: 08/07/2023  Primary Care Provider:  Domingo Sep, MD Primary Cardiologist:  Parke Poisson, MD  Chief Complaint    Hypertension - Advanced hypertension clinic  Past Medical History   HLD On pravastatin 20  hypothyroid On levothyroxine 25 mcg    Allergies  Allergen Reactions   Codeine    Hydrocodone Other (See Comments)    Hyperactivity   Wound Dressing Adhesive Rash    History of Present Illness    Sandra Bruce is a 77 y.o. female patient who was referred to the [facility] by Dr. Jacques Navy.  She reported two syncopal episodes, one a few years prior and another more recently, both while walking. Prior echo and BP monitor were reportedly unremarkable. She has also struggled with labile blood pressures, and has known intolerances of several antihypertensives. At that visit her blood pressure was 140/90 on clonidine. They switched her clonidine to losartan 25 mg daily as well as prn hydralazine for BP >160/100.  At her last visit with Dr. Jacques Navy in February she was still struggling with labile pressures and presyncopal episodes.  Home pressures ranged from 102/56 to 165/95.  Patient reports that this has been occurring for about 10 years.   Most recently she saw Dr. Duke Salvia about 4 weeks ago.  At that time pressure was 150/80.  Dr. Duke Salvia ordered 24 hr urine collection for pheochromocytoma as well as blood work for hyperaldosteronism.   She also ordered a 24 hour BP cuff to determine the extent of fluctuations.  Blood and urine were negative for secondary causes.  Today she is in the office for follow up.  She feels as though her episodes of weakness and presyncope are getting to be more frequent and longer to recover.  Notes that it is more common in the mornings, but can happen at any time of the day.  States that she feels best when systolic in the 120-130 range, will start to feel weakness below  120.  In reviewing her medications, she has not used the Singulair in the past month or so and only used alprazolam twice.  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  Of note she uses the amitriptyline for chronic diarrhea.  Unsure exactly when this was started, in our charting it first appears in Dr. Lupe Carney note from 08/2022.   Blood Pressure Goal:  130/80  Current Medications: losartan 25 mg daily, hydralazine 25 mg prn BP > 160/100  Previously tried:   Olmesartan - significant diarrhea Atenolol - nausea Chlorthalidone - nausea Toprol XL - dizziness Labetalol - ineffective Clonidine - switched to losartan  Family Hx:   father with history of MI,died at 8 mother with hypertension, stroke at 29  Social Hx:      Tobacco: no  Alcohol: socially  Caffeine: hot tea in the mornings (not herbal), never after 1 pm  Diet:    mostly home cooked meals, does not add salt; not much meat, breakfast is cereal/fruit or egg/muffin; vegetables are fresh (broccoli esp), lots of fruit  Exercise: yoga (as pressure allows) 3-4 times per week  Home BP readings:       Accessory Clinical Findings    Lab Results  Component Value Date   CREATININE 0.85 03/12/2020   BUN 10 03/12/2020   NA 137 03/12/2020   K 4.3  03/12/2020   CL 102 03/12/2020   CO2 20 03/12/2020   Lab Results  Component Value Date   ALT 18 07/10/2012   AST 19 07/10/2012   ALKPHOS 76 07/10/2012   BILITOT 0.2 (L) 07/10/2012   No results found for: "HGBA1C"  Screening for Secondary Hypertension:      06/16/2023    8:18 AM  Causes  Drugs/Herbals Screened     - Comments limits salt.  2 teas/day  +NSAIDS rarely  Renovascular HTN Screened     - Comments check renal Dopplers  Sleep Apnea Screened     - Comments no symptoms  Thyroid Disease Screened  Hyperaldosteronism Screened     - Comments check renin and aldosterone  Pheochromocytoma Screened     -  Comments check catecholamines and metanephrines  Cushing's Syndrome N/A  Hyperparathyroidism N/A  Coarctation of the Aorta Screened     - Comments BP symmetric  Compliance Screened    Relevant Labs/Studies:    Latest Ref Rng & Units 03/12/2020   11:09 AM 12/08/2017    9:08 AM 07/10/2012   12:50 PM  Basic Labs  Sodium 134 - 144 mmol/L 137  139  141   Potassium 3.5 - 5.2 mmol/L 4.3  4.0  4.2   Creatinine 0.57 - 1.00 mg/dL 4.09  8.11  9.14           Latest Ref Rng & Units 06/16/2023    9:02 AM  Renin/Aldosterone   Aldosterone 0.0 - 30.0 ng/dL 5.4   Aldos/Renin Ratio 0.0 - 30.0 1.7                Home Medications    Current Outpatient Medications  Medication Sig Dispense Refill   ALPRAZolam (XANAX PO) Take 0.25 mg by mouth at bedtime as needed.      amitriptyline (ELAVIL) 25 MG tablet Take 25 mg by mouth at bedtime.     Cyanocobalamin 5000 MCG SUBL Place 1 mg under the tongue daily.     hydrALAZINE (APRESOLINE) 25 MG tablet Take  25 mg tablet twice a day if needed for blood pressure greater than 160/100 60 tablet 6   levothyroxine (SYNTHROID) 25 MCG tablet Take 1 tablet by mouth daily.     losartan (COZAAR) 25 MG tablet Take 1 tablet (25 mg total) by mouth daily. 90 tablet 3   montelukast (SINGULAIR) 10 MG tablet Take 1 tablet by mouth as needed.     Multiple Vitamin (MULTI-VITAMIN) tablet Take 1 tablet by mouth daily.     omeprazole (PRILOSEC) 40 MG capsule Take 40 mg by mouth daily.     pravastatin (PRAVACHOL) 20 MG tablet Take by mouth.     estrogens, conjugated, (PREMARIN) 0.625 MG tablet Take 0.625 mg by mouth daily. Take daily for 21 days then do not take for 7 days. (Patient not taking: Reported on 06/16/2023)     No current facility-administered medications for this visit.     Assessment & Plan   130/79  Labile hypertension Assessment: BP is controlled in office BP 115/71 mmHg;   Orthostatic readings show no drop with standing at 0 and 2 minutes (increased  at 2 minutes) Notes starts to feel weakness/pre-syncopal at SBP < 115 Tolerates losartan and hydralazine well without any side effects Denies SOB, palpitation, chest pain, headaches,or swelling No indication of any neurologic causes of hypotension In reviewing medications, amitriptyline has been know to cause orthostatic hypotension as well as tachycardia and palpitations.   Plan:  Continue  taking losartan 25 mg once daily Continue with hydralazine 25 mg for BP > 160/100, max of 1 tablet daily Patient to keep record of BP readings with heart rate and report to [place] at the next visit Patient encouraged to take small salty snack with water when BP < 115 systolic, to see if this will help prevent drops to < 100.   Patient to follow up with PharmD in 1 month  Labs ordered today:  none   Phillips Hay PharmD CPP [facility]  [address] [place], [place] [address] [phone]

## 2023-07-14 ENCOUNTER — Ambulatory Visit
Payer: Federal, State, Local not specified - PPO | Attending: Family | Admitting: Pharmacist Clinician (PhC)/ Clinical Pharmacy Specialist

## 2023-07-14 ENCOUNTER — Encounter: Payer: Self-pay | Admitting: Pharmacist Clinician (PhC)/ Clinical Pharmacy Specialist

## 2023-07-14 DIAGNOSIS — R0989 Other specified symptoms and signs involving the circulatory and respiratory systems: Secondary | ICD-10-CM

## 2023-07-14 NOTE — Patient Instructions (Signed)
Follow up appointment: Friday Dec 20 at the [facility] office at 11:30 am  Take your BP meds as follows:  continue with the losartan 25 mg once daily  If your pressure is > 160/100 (either number), then take 25 mg of hydralazine - limit to 1 tablet daily If your pressure goes below 115 (top number) then have a salty snack and cup of cold water.  Let's see if this prevents your pressure from dropping too low.    Check your blood pressure at home daily (if able) and keep record of the readings.  Hypertension "High blood pressure"  Hypertension is often called "The Silent Killer." It rarely causes symptoms until it is extremely  high or has done damage to other organs in the body. For this reason, you should have your  blood pressure checked regularly by your physician. We will check your blood pressure  every time you see a provider at one of our offices.   Your blood pressure reading consists of two numbers. Ideally, blood pressure should be  below 120/80. The first ("top") number is called the systolic pressure. It measures the  pressure in your arteries as your heart beats. The second ("bottom") number is called the diastolic pressure. It measures the pressure in your arteries as the heart relaxes between beats.  The benefits of getting your blood pressure under control are enormous. A 10-point  reduction in systolic blood pressure can reduce your risk of stroke by 27% and heart failure by 28%  To check your pressure at home you will need to:  1. Sit up in a chair, with feet flat on the floor and back supported. Do not cross your ankles or legs. 2. Rest your left arm so that the cuff is about heart level. If the cuff goes on your upper arm,  then just relax the arm on the table, arm of the chair or your lap. If you have a wrist cuff, we  suggest relaxing your wrist against your chest (think of it as Pledging the Flag with the  wrong arm).  3. Place the cuff snugly around your arm,  about 1 inch above the crook of your elbow. The  cords should be inside the groove of your elbow.  4. Sit quietly, with the cuff in place, for about 5 minutes. After that 5 minutes press the power  button to start a reading. 5. Do not talk or move while the reading is taking place.  6. Record your readings on a sheet of paper. Although most cuffs have a memory, it is often  easier to see a pattern developing when the numbers are all in front of you.  7. You can repeat the reading after 1-3 minutes if it is recommended  Make sure your bladder is empty and you have not had caffeine or tobacco within the last 30 min  Always bring your blood pressure log with you to your appointments. If you have not brought your monitor in to be double checked for accuracy, please bring it to your next appointment.  You can find a list of quality blood pressure cuffs at validatebp.org

## 2023-07-14 NOTE — Assessment & Plan Note (Addendum)
Assessment: BP is controlled in office BP 115/71 mmHg;   Orthostatic readings show no drop with standing at 0 and 2 minutes (increased at 2 minutes) Notes starts to feel weakness/pre-syncopal at SBP < 115 Tolerates losartan and hydralazine well without any side effects Denies SOB, palpitation, chest pain, headaches,or swelling No indication of any neurologic causes of hypotension In reviewing medications, amitriptyline has been know to cause orthostatic hypotension as well as tachycardia and palpitations.   Plan:  Continue taking losartan 25 mg once daily Continue with hydralazine 25 mg for BP > 160/100, max of 1 tablet daily Patient to keep record of BP readings with heart rate and report to [place] at the next visit Patient encouraged to take small salty snack with water when BP < 115 systolic, to see if this will help prevent drops to < 100.   Patient to follow up with PharmD in 1 month  Labs ordered today:  none

## 2023-08-02 ENCOUNTER — Ambulatory Visit (HOSPITAL_BASED_OUTPATIENT_CLINIC_OR_DEPARTMENT_OTHER): Payer: Federal, State, Local not specified - PPO | Admitting: Family

## 2023-08-13 ENCOUNTER — Encounter (HOSPITAL_BASED_OUTPATIENT_CLINIC_OR_DEPARTMENT_OTHER): Payer: Self-pay | Admitting: Pharmacist Clinician (PhC)/ Clinical Pharmacy Specialist

## 2023-08-13 ENCOUNTER — Ambulatory Visit (HOSPITAL_BASED_OUTPATIENT_CLINIC_OR_DEPARTMENT_OTHER): Payer: Federal, State, Local not specified - PPO | Admitting: Pharmacist Clinician (PhC)/ Clinical Pharmacy Specialist

## 2023-08-13 VITALS — BP 129/82 | HR 88

## 2023-08-13 DIAGNOSIS — R0989 Other specified symptoms and signs involving the circulatory and respiratory systems: Secondary | ICD-10-CM

## 2023-08-13 NOTE — Progress Notes (Unsigned)
Office Visit    Patient Name: Sandra Bruce Date of Encounter: 08/13/2023  Primary Care Provider:  Domingo Sep, MD Primary Cardiologist:  Parke Poisson, MD  Chief Complaint    Hypertension - Advanced hypertension clinic  Past Medical History   HLD On pravastatin 20  hypothyroid On levothyroxine 25 mcg    Allergies  Allergen Reactions   Codeine    Hydrocodone Other (See Comments)    Hyperactivity   Wound Dressing Adhesive Rash    History of Present Illness    Sandra Bruce is a 77 y.o. female patient who was referred to the [facility] by Dr. Jacques Navy.  She reported two syncopal episodes, one a few years prior and another more recently, both while walking. Prior echo and BP monitor were reportedly unremarkable. She has also struggled with labile blood pressures, and has known intolerances of several antihypertensives. At that visit her blood pressure was 140/90 on clonidine. They switched her clonidine to losartan 25 mg daily as well as prn hydralazine for BP >160/100.  At her last visit with Dr. Jacques Navy in February she was still struggling with labile pressures and presyncopal episodes.  Home pressures ranged from 102/56 to 165/95.  Patient reports that this has been occurring for about 10 years.  She then saw Dr. Duke Salvia in October  At that time pressure was 150/80.  Dr. Duke Salvia ordered 24 hr urine collection for pheochromocytoma as well as blood work for hyperaldosteronism.   She also ordered a 24 hour BP cuff to determine the extent of fluctuations.  Blood and urine were negative for secondary causes.  At this point she has still not been called to do the 24 hour cuff.  I saw her in November and she continued to have labile readings, with systolic going from < 100 to as high as 200.    Today she returns for follow up.  In reviewing her medications after the last visit, I wonder if the orthostatic readings are being caused (in part) to her amitriptyline.    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  We had a long discussion about that today.  She gets the medication from GI and usually sees them in the spring.   Since her last visit, her lowest home BP reading was 61/52.    Blood Pressure Goal:  130/80  Current Medications: losartan 25 mg daily, hydralazine 25 mg prn BP > 160/100  Previously tried:   Olmesartan - significant diarrhea Atenolol - nausea Chlorthalidone - nausea Toprol XL - dizziness Labetalol - ineffective Clonidine - switched to losartan  Family Hx:   father with history of MI,died at 4 mother with hypertension, stroke at 76  Social Hx:      Tobacco: no  Alcohol: socially  Caffeine: hot tea in the mornings (not herbal), never after 1 pm  Diet:    mostly home cooked meals, does not add salt; not much meat, breakfast is cereal/fruit or egg/muffin; vegetables are fresh (broccoli esp), lots of fruit  Exercise: yoga (as pressure allows) 3-4 times per week  Home BP readings:  none with her, but notes takes hydralazine (for systolic > 160) about twice weekly, lowest reading was 61/52   Accessory Clinical Findings    Lab Results  Component Value Date   CREATININE 0.85 03/12/2020   BUN 10 03/12/2020   NA 137 03/12/2020  K 4.3 03/12/2020   CL 102 03/12/2020   CO2 20 03/12/2020   Lab Results  Component Value Date   ALT 18 07/10/2012   AST 19 07/10/2012   ALKPHOS 76 07/10/2012   BILITOT 0.2 (L) 07/10/2012   No results found for: "HGBA1C"  Screening for Secondary Hypertension:      06/16/2023    8:18 AM  Causes  Drugs/Herbals Screened     - Comments limits salt.  2 teas/day  +NSAIDS rarely  Renovascular HTN Screened     - Comments check renal Dopplers  Sleep Apnea Screened     - Comments no symptoms  Thyroid Disease Screened  Hyperaldosteronism Screened     - Comments check renin and aldosterone  Pheochromocytoma Screened     -  Comments check catecholamines and metanephrines  Cushing's Syndrome N/A  Hyperparathyroidism N/A  Coarctation of the Aorta Screened     - Comments BP symmetric  Compliance Screened    Relevant Labs/Studies:    Latest Ref Rng & Units 03/12/2020   11:09 AM 12/08/2017    9:08 AM 07/10/2012   12:50 PM  Basic Labs  Sodium 134 - 144 mmol/L 137  139  141   Potassium 3.5 - 5.2 mmol/L 4.3  4.0  4.2   Creatinine 0.57 - 1.00 mg/dL 1.30  8.65  7.84           Latest Ref Rng & Units 06/16/2023    9:02 AM  Renin/Aldosterone   Aldosterone 0.0 - 30.0 ng/dL 5.4   Aldos/Renin Ratio 0.0 - 30.0 1.7                Home Medications    Current Outpatient Medications  Medication Sig Dispense Refill   ALPRAZolam (XANAX PO) Take 0.25 mg by mouth at bedtime as needed.      amitriptyline (ELAVIL) 25 MG tablet Take 25 mg by mouth at bedtime.     Cyanocobalamin 5000 MCG SUBL Place 1 mg under the tongue daily.     hydrALAZINE (APRESOLINE) 25 MG tablet Take  25 mg tablet twice a day if needed for blood pressure greater than 160/100 60 tablet 6   levothyroxine (SYNTHROID) 25 MCG tablet Take 1 tablet by mouth daily.     losartan (COZAAR) 25 MG tablet Take 1 tablet (25 mg total) by mouth daily. 90 tablet 3   montelukast (SINGULAIR) 10 MG tablet Take 1 tablet by mouth as needed.     Multiple Vitamin (MULTI-VITAMIN) tablet Take 1 tablet by mouth daily.     omeprazole (PRILOSEC) 40 MG capsule Take 40 mg by mouth daily.     pravastatin (PRAVACHOL) 20 MG tablet Take by mouth.     No current facility-administered medications for this visit.     Assessment & Plan    Labile hypertension Assessment: BP is controlled in office BP 129/82 mmHg;  Still having episodes of hypotension, wonder if may be caused by amitriptyline Tolerates losartan and hydralazine well without any side effects Denies SOB, palpitation, chest pain, headaches,or swelling Reiterated the importance of regular exercise and low salt diet    Plan:  Continue taking losartan and hydralazine as prescribed Try cutting amitriptyline to 12.5 mg once daily to see if this has any impact on hypotensive episodes Patient to keep record of BP readings with heart rate and report to [place] at the next visit Patient to follow up with Dr. Duke Salvia in February  Labs ordered today:  none   Phillips Hay PharmD  CPP [facility]  [address] [place], [place] [address] [phone]

## 2023-08-13 NOTE — Assessment & Plan Note (Signed)
Assessment: BP is controlled in office BP 129/82 mmHg;  Still having episodes of hypotension, wonder if may be caused by amitriptyline Tolerates losartan and hydralazine well without any side effects Denies SOB, palpitation, chest pain, headaches,or swelling Reiterated the importance of regular exercise and low salt diet   Plan:  Continue taking losartan and hydralazine as prescribed Try cutting amitriptyline to 12.5 mg once daily to see if this has any impact on hypotensive episodes Patient to keep record of BP readings with heart rate and report to [place] at the next visit Patient to follow up with Dr. Duke Salvia in February  Labs ordered today:  none

## 2023-08-13 NOTE — Patient Instructions (Signed)
Follow up appointment: with Dr. Duke Salvia in February  I will follow up on the order for the 24 hour BP monitor  Take your BP meds as follows: continue with your current medications  After the holiday try decreasing your amitriptyline to 1/2 tablet once daily  Check your blood pressure at home daily (if able) and keep record of the readings.   To check your pressure at home you will need to:  1. Sit up in a chair, with feet flat on the floor and back supported. Do not cross your ankles or legs. 2. Rest your left arm so that the cuff is about heart level. If the cuff goes on your upper arm,  then just relax the arm on the table, arm of the chair or your lap. If you have a wrist cuff, we  suggest relaxing your wrist against your chest (think of it as Pledging the Flag with the  wrong arm).  3. Place the cuff snugly around your arm, about 1 inch above the crook of your elbow. The  cords should be inside the groove of your elbow.  4. Sit quietly, with the cuff in place, for about 5 minutes. After that 5 minutes press the power  button to start a reading. 5. Do not talk or move while the reading is taking place.  6. Record your readings on a sheet of paper. Although most cuffs have a memory, it is often  easier to see a pattern developing when the numbers are all in front of you.  7. You can repeat the reading after 1-3 minutes if it is recommended  Make sure your bladder is empty and you have not had caffeine or tobacco within the last 30 min  Always bring your blood pressure log with you to your appointments. If you have not brought your monitor in to be double checked for accuracy, please bring it to your next appointment.  You can find a list of quality blood pressure cuffs at WirelessNovelties.no  Important lifestyle changes to control high blood pressure  Intervention  Effect on the BP  Lose extra pounds and watch your waistline Weight loss is one of the most effective lifestyle changes  for controlling blood pressure. If you're overweight or obese, losing even a small amount of weight can help reduce blood pressure. Blood pressure might go down by about 1 millimeter of mercury (mm Hg) with each kilogram (about 2.2 pounds) of weight lost.  Exercise regularly As a general goal, aim for at least 30 minutes of moderate physical activity every day. Regular physical activity can lower high blood pressure by about 5 to 8 mm Hg.  Eat a healthy diet Eating a diet rich in whole grains, fruits, vegetables, and low-fat dairy products and low in saturated fat and cholesterol. A healthy diet can lower high blood pressure by up to 11 mm Hg.  Reduce salt (sodium) in your diet Even a small reduction of sodium in the diet can improve heart health and reduce high blood pressure by about 5 to 6 mm Hg.  Limit alcohol One drink equals 12 ounces of beer, 5 ounces of wine, or 1.5 ounces of 80-proof liquor.  Limiting alcohol to less than one drink a day for women or two drinks a day for men can help lower blood pressure by about 4 mm Hg.   If you have any questions or concerns please use My Chart to send questions or call the office at [phone]

## 2023-08-30 ENCOUNTER — Ambulatory Visit: Payer: Federal, State, Local not specified - PPO | Attending: Cardiovascular Disease

## 2023-08-30 DIAGNOSIS — R0989 Other specified symptoms and signs involving the circulatory and respiratory systems: Secondary | ICD-10-CM

## 2023-08-30 DIAGNOSIS — R030 Elevated blood-pressure reading, without diagnosis of hypertension: Secondary | ICD-10-CM | POA: Diagnosis not present

## 2023-08-30 NOTE — Progress Notes (Unsigned)
 24 hour ambulatory blood pressure monitor applied to patients right arm using small adult cuff.

## 2023-09-20 ENCOUNTER — Encounter (HOSPITAL_BASED_OUTPATIENT_CLINIC_OR_DEPARTMENT_OTHER): Payer: Self-pay | Admitting: Cardiovascular Disease

## 2023-09-20 ENCOUNTER — Ambulatory Visit (HOSPITAL_BASED_OUTPATIENT_CLINIC_OR_DEPARTMENT_OTHER): Payer: Federal, State, Local not specified - PPO | Admitting: Cardiovascular Disease

## 2023-09-20 ENCOUNTER — Encounter (HOSPITAL_BASED_OUTPATIENT_CLINIC_OR_DEPARTMENT_OTHER): Payer: Federal, State, Local not specified - PPO | Admitting: Cardiovascular Disease

## 2023-09-20 VITALS — BP 121/79 | HR 107 | Ht 61.0 in | Wt 133.0 lb

## 2023-09-20 DIAGNOSIS — E7800 Pure hypercholesterolemia, unspecified: Secondary | ICD-10-CM | POA: Insufficient documentation

## 2023-09-20 DIAGNOSIS — R0989 Other specified symptoms and signs involving the circulatory and respiratory systems: Secondary | ICD-10-CM

## 2023-09-20 HISTORY — DX: Pure hypercholesterolemia, unspecified: E78.00

## 2023-09-20 NOTE — Progress Notes (Signed)
[facility] Follow up:    Date:  09/20/2023   ID:  Sandra Bruce, DOB [birth year], MRN [id number]  PCP:  Domingo Sep, MD  Cardiologist:  Parke Poisson, MD   Referring MD: Domingo Sep, MD   CC: Hypertension  History of Present Illness:    Sandra Bruce is a 78 y.o. female with a hx of hypertension, hyperlipidemia, and depression, here for follow up.  She first established are in the [facility] 05/2023. She is a patient of Dr. Weston Brass, initially seen by her 02/2020. She reported two syncopal episodes, one a few years prior and another more recently, both while walking. Prior echo and BP monitor were reportedly unremarkable. She has also struggled with labile blood pressures, and has known intolerances of several antihypertensives. At that visit her blood pressure was 140/90 on clonidine. They switched her clonidine to losartan 25 mg daily as well as prn hydralazine for BP >160/100.   She had carotid dopplers 08/2022 that were near-normal with only minimal plaque. She wore a monitor that showed no evidence of arrhythmias. Her echo 09/2022 revealed LVEF >75%, grade 1 diastolic dysfunction, trivial mitral regurgitation, and aortic valve sclerosis but no stenosis. Previously seen for chest pain with normal ETT in 2016.  She was last seen by Dr. Jacques Navy 10/19/2022. She was still struggling with labile blood pressures and presyncopal episodes. At one time her blood pressure was 102/56 and she was presyncopal with multiple position changes. One week prior her BP was 165/95 and she took her prn hydralazine, which she had only needed a few times per year. It was noted that her weak spells did not correlate consistently with hypotensive episodes.  She called the office 05/25/2023 and reported her blood pressure was elevated to 200/108 the night before, 136/73 at 3:30 AM, and 184/104 (in the right, 160/92 in the left) later that morning. She took an extra  hydralazine that night and in the morning. She also noted that her blood pressure was labile from 90s-190s systolic. She was referred to the [facility].  At her visit 05/2023 she reported feeling better when her blood pressures were in the 130s over 70s and has weakness and near syncope when blood pressures are lower.  She was taking labetalol and hydralazine only as needed.  24-hour urine was negative for catecholamines and metanephrine abnormalities.  She wore a 24-hour blood pressure monitor that revealed an average blood pressure of 126/66 and heart rate of 87.  The max reading was 170/108 and the minimal reading was 93/39.  She saw our pharmacist, Phillips Hay and there was some concern that nortriptyline may be contributing to her orthostatic symptoms.  She continued to have labile blood pressures with as low as 61/52.  She recommended trying to reduce the dose of amitriptyline.  Sandra Bruce presents with episodes of weakness and near syncope occurring several times a week. These episodes are often triggered by physical exertion, such as walking into a store, and are usually brief, resolving with rest. She has learned to recognize these episodes and will stop activity or sit down to prevent fainting.  Her blood pressure readings at home range from 120s to 150s systolic, but during these episodes, it can drop as low as 60/55. The patient is currently on Losartan daily and Hydralazine as needed, but has been hesitant to take the Hydralazine due to fear of precipitating a hypotensive episode.  Sandra Bruce also reports a new symptom of  feeling extremely cold around 3 o'clock in the house, requiring gloves. However, thyroid function tests were recently checked and were within normal limits.  Her symptoms have been impacting her daily activities, including her ability to participate in yoga classes due to fear of fainting. Despite these challenges, the patient has been managing her symptoms  and continues to be active.     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    amitriptyline (ELAVIL) 25 MG tablet Take 25 mg by mouth at bedtime.   Cyanocobalamin 5000 MCG SUBL Place 1 mg under the tongue daily.   estrogens, conjugated, (PREMARIN) 0.625 MG tablet Take 0.625 mg by mouth daily. Take daily for 21 days then do not take for 7 days.   hydrALAZINE (APRESOLINE) 25 MG tablet Take  25 mg tablet twice a day if needed for blood pressure greater than 160/100   levothyroxine (SYNTHROID) 25 MCG tablet Take 1 tablet by mouth daily.   losartan (COZAAR) 25 MG tablet Take 25 mg by mouth as directed. TAKE 1/2 TABLET DAILY IN THE EVENING   montelukast (SINGULAIR) 10 MG tablet Take 1 tablet by mouth as needed.   Multiple Vitamin (MULTI-VITAMIN) tablet Take 1 tablet by mouth daily.   omeprazole (PRILOSEC) 40 MG capsule Take 40 mg by mouth daily.   pravastatin (PRAVACHOL) 20 MG tablet Take by mouth.   [DISCONTINUED] losartan (COZAAR) 25 MG tablet Take 1 tablet (25 mg total) by mouth daily. (Patient taking differently: Take 12.5 mg by mouth daily.)     Allergies:   Codeine, Hydrocodone, and Wound dressing adhesive   Social History   Socioeconomic History   Marital status: Divorced    Spouse name: Not on file   Number of children: Not on file   Years of education: Not on file   Highest education level: Not on file   Occupational History   Not on file  Tobacco Use   Smoking status: Never   Smokeless tobacco: Never  Vaping Use   Vaping status: Never Used  Substance and Sexual Activity   Alcohol use: Yes    Alcohol/week: 4.0 standard drinks of alcohol    Types: 4 Glasses of wine per week   Drug use: Never   Sexual activity: Not on file  Other Topics Concern   Not on file  Social History Narrative   Not on file   Social Drivers of Health   Financial Resource Strain: Patient Declined (06/16/2023)   Overall Financial Resource Strain (CARDIA)    Difficulty of Paying Living Expenses: Patient declined  Food Insecurity: No Food Insecurity (06/16/2023)   Hunger Vital Sign    Worried About Running Out of Food in the Last Year: Never true    Ran Out of Food in the Last Year: Never true  Transportation Needs: No Transportation Needs (06/16/2023)   PRAPARE - Administrator, Civil Service (Medical): No  Lack of Transportation (Non-Medical): No  Physical Activity: Sufficiently Active (06/16/2023)   Exercise Vital Sign    Days of Exercise per Week: 4 days    Minutes of Exercise per Session: 50 min  Stress: No Stress Concern Present (06/16/2023)   [employer] of Occupational Health - Occupational Stress Questionnaire    Feeling of Stress : Not at all  Social Connections: Socially Isolated (06/16/2023)   Social Connection and Isolation Panel [NHANES]    Frequency of Communication with Friends and Family: More than three times a week    Frequency of Social Gatherings with Friends and Family: More than three times a week    Attends Religious Services: Never    Database administrator or Organizations: No    Attends Engineer, structural: Never    Marital Status: Divorced    Family History: The patient's family history includes Heart attack in her father; Hypertension in her mother; Stroke in her mother. There is no history of Breast cancer.  ROS:   Please see the history  of present illness.    (+) Generalized weakness (+) Near-syncope All other systems reviewed and are negative.  EKGs/Labs/Other Studies Reviewed:    Echo  10/12/2022:  1. Global longitudinal strain is -21.2% (normal). Left ventricular  ejection fraction, by estimation, is >75%. The left ventricle has  hyperdynamic function. The left ventricle has no regional wall motion  abnormalities. Left ventricular diastolic  parameters are consistent with Grade I diastolic dysfunction (impaired  relaxation).   2. Right ventricular systolic function is normal. The right ventricular  size is normal.   3. The mitral valve is normal in structure. Trivial mitral valve  regurgitation.   4. The aortic valve is tricuspid. Aortic valve regurgitation is not  visualized. Aortic valve sclerosis is present, with no evidence of aortic  valve stenosis.   Comparison(s): The left ventricular function is unchanged.   Monitor 09/2022: Indication: Syncope   Minimum HR (bpm): 52 Maximum HR (bpm): 136   Supraventricular Ectopy: rare <1% SVT: none   Ventricular Ectopy: rare <1% NSVT: none Ventricular Tachycardia: none   Pauses: none AV block: none   Atrial fibrillation: none   Diary events: "tired/fatigued/passed out, chest pain, SOB" correlate with sinus rhythm and sinus tachycardia.    IMPRESSION: No arrhythmia associated with syncope or "passed out" diary events.   Bilateral Carotid Dopplers  09/17/2022: Summary:  Right Carotid: The extracranial vessels were near-normal with only minimal  wall thickening or plaque.   Left Carotid: The extracranial vessels were near-normal with only minimal  wall thickening or plaque.   Vertebrals:  Bilateral vertebral arteries demonstrate antegrade flow.  Subclavians: Normal flow hemodynamics were seen in bilateral subclavian arteries.   EKG:  EKG is personally reviewed. 06/16/2023: Not ordered.  Recent Labs: No results found for requested labs within last  365 days.   Recent Lipid Panel No results found for: "CHOL", "TRIG", "HDL", "CHOLHDL", "VLDL", "LDLCALC", "LDLDIRECT"  Physical Exam:    VS:  BP 121/79   Pulse (!) 107   Ht 5\' 1"  (1.549 m)   Wt 133 lb (60.3 kg)   SpO2 94%   BMI 25.13 kg/m  , BMI Body mass index is 25.13 kg/m. GENERAL:  Well appearing HEENT: Pupils equal round and reactive, fundi not visualized, oral mucosa unremarkable NECK:  No jugular venous distention, waveform within normal limits, carotid upstroke brisk and symmetric, no bruits, no thyromegaly LUNGS:  Clear to auscultation bilaterally HEART:  RRR.  PMI  not displaced or sustained, S1 and S2 within normal limits, no S3, no S4, no clicks, no rubs, no murmurs ABD:  Flat, positive bowel sounds normal in frequency in pitch, no bruits, no rebound, no guarding, no midline pulsatile mass, no hepatomegaly, no splenomegaly EXT:  2 plus pulses throughout, no edema, no cyanosis, no clubbing SKIN:  No rashes, no nodules NEURO:  Cranial nerves II through XII grossly intact, motor grossly intact throughout PSYCH:  Cognitively intact, oriented to person place and time  ASSESSMENT/PLAN:    # Labile Hypertension Episodes of high blood pressure (up to 150s-160s) and low blood pressure (as low as 60/55) with associated symptoms of weakness and near syncope. Episodes occur several times a week. Current treatment with Losartan daily and Hydralazine as needed. Discussed the possibility of dysautonomia and the risk of falls due to low blood pressure. -Reduce Losartan to 12.5mg  and switch to nighttime dosing. -Check blood pressure twice daily (morning and evening) for a couple of weeks to monitor response to medication adjustment. -Return in 6 months or sooner if blood pressure becomes unstable.  # Hyperlipidemia:  Triglycerides are elevated but lipids are otherwise well-controlled.  Continue pravastatin.     Screening for Secondary Hypertension:     06/16/2023    8:18 AM  Causes   Drugs/Herbals Screened     - Comments limits salt.  2 teas/day  +NSAIDS rarely  Renovascular HTN Screened     - Comments check renal Dopplers  Sleep Apnea Screened     - Comments no symptoms  Thyroid Disease Screened  Hyperaldosteronism Screened     - Comments check renin and aldosterone  Pheochromocytoma Screened     - Comments check catecholamines and metanephrines  Cushing's Syndrome N/A  Hyperparathyroidism N/A  Coarctation of the Aorta Screened     - Comments BP symmetric  Compliance Screened    Relevant Labs/Studies:    Latest Ref Rng & Units 03/12/2020   11:09 AM 12/08/2017    9:08 AM 07/10/2012   12:50 PM  Basic Labs  Sodium 134 - 144 mmol/L 137  139  141   Potassium 3.5 - 5.2 mmol/L 4.3  4.0  4.2   Creatinine 0.57 - 1.00 mg/dL 7.82  9.56  2.13    Disposition:    FU with Skiler Tye C. Duke Salvia, MD, [facility] in 6 months.  Medication Adjustments/Labs and Tests Ordered: Current medicines are reviewed at length with the patient today.  Concerns regarding medicines are outlined above.   No orders of the defined types were placed in this encounter.  No orders of the defined types were placed in this encounter.  Signed, Chilton Si, MD  09/20/2023 11:34 AM    [facility]

## 2023-09-20 NOTE — Patient Instructions (Addendum)
Medication Instructions:  DECREASE LOSARTAN TO 12.5 MG IN THE EVENING   Labwork: NONE  Testing/Procedures: NONE  Follow-Up: 6 MONTHS WITH DR [place] OR CAITLIN W NP IN [facility] HTN CLINIC   Any Other Special Instructions Will Be Listed Below (If Applicable). MONITOR YOUR BLOOD PRESSURE TWICE A DAY FOR THE NEXT FEW WEEKS. CALL THE OFFICE IF THEY ARE CONSISTENTLY ABOVE 130   If you need a refill on your cardiac medications before your next appointment, please call your pharmacy.

## 2023-10-14 ENCOUNTER — Encounter (HOSPITAL_BASED_OUTPATIENT_CLINIC_OR_DEPARTMENT_OTHER): Payer: Federal, State, Local not specified - PPO | Admitting: Cardiovascular Disease

## 2024-04-20 ENCOUNTER — Ambulatory Visit (INDEPENDENT_AMBULATORY_CARE_PROVIDER_SITE_OTHER): Admitting: Family

## 2024-04-20 ENCOUNTER — Encounter (HOSPITAL_BASED_OUTPATIENT_CLINIC_OR_DEPARTMENT_OTHER): Payer: Self-pay | Admitting: Family

## 2024-04-20 VITALS — BP 120/66 | HR 101 | Ht 60.0 in | Wt 133.9 lb

## 2024-04-20 DIAGNOSIS — R0989 Other specified symptoms and signs involving the circulatory and respiratory systems: Secondary | ICD-10-CM | POA: Diagnosis not present

## 2024-04-20 MED ORDER — LOSARTAN POTASSIUM 25 MG PO TABS: ORAL_TABLET | ORAL | 2 refills | Status: AC

## 2024-04-20 NOTE — Progress Notes (Signed)
 Advanced Hypertension Clinic Assessment:    Date:  04/20/2024   ID:  Sandra Bruce, [place] 1945-08-26, MRN [id number]  PCP:  Trenton Shows, MD  Cardiologist:  Soyla DELENA Merck, MD  Nephrologist:  Referring MD: Trenton Shows, MD   CC: Hypertension  History of Present Illness:    Sandra Bruce is a 78 y.o. female with a hx of HTN, HLD, depression. Established with [facility] 05/2023 . Noted history of syncope with labile blood pressures and intolerances to several antihypertensives.   Normal ETT 2016. Carotid doppler 08/2022 near normal with minimal plaque. Monitor with no arrhythmias. Echo 09/2022 LVEF >75%, grr1dd, trivial MR, aortic valve sclerosis without stenosis. 24 hour urine negative of catecholamine or metanephrine abnormalities. 24h BP monitor with average BP 126/66 and heart rate 87 with BP range 93/39-170/108. There was concern that Amitryptiline may be contributory to labile blood pressures.   At last visi there Hydralazine  was reduced to 1.25mg  daily and moved to evening dosing to prevent lightheadedness.  Presents today for follow up. Notes requiring her PRN Hydralazine  for SBP >170 more than twice per week. No recurrent lightheadedness, dizziness. No chest pain, exertional dyspnea, edema, orthopnea, PND.  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    amitriptyline (ELAVIL) 25 MG tablet  Take 25 mg by mouth at bedtime.   Cyanocobalamin 5000 MCG SUBL Place 1 mg under the tongue daily.   Docosanol 10 % CREA Apply topically.   estrogens, conjugated, (PREMARIN) 0.625 MG tablet Take 0.625 mg by mouth daily. Take daily for 21 days then do not take for 7 days.   hydrALAZINE  (APRESOLINE ) 25 MG tablet Take  25 mg tablet twice a day if needed for blood pressure greater than 160/100   levothyroxine (SYNTHROID) 25 MCG tablet Take 1 tablet by mouth daily.   mirtazapine (REMERON) 7.5 MG tablet Take 7.5 mg by mouth at bedtime.   montelukast (SINGULAIR) 10 MG tablet Take 1 tablet by mouth as needed.   Multiple Vitamin (MULTI-VITAMIN) tablet Take 1 tablet by mouth daily.   omeprazole (PRILOSEC) 40 MG capsule Take 40 mg by mouth daily.   pravastatin (PRAVACHOL) 20 MG tablet Take by mouth.   [DISCONTINUED] losartan  (COZAAR ) 25 MG tablet Take 25 mg by mouth as directed. TAKE 1/2 TABLET DAILY IN THE EVENING     Allergies:   Olmesartan, Metoprolol tartrate, Wound dressing adhesive, Codeine, Hydrocodone, Sertraline, Atenolol, and Chlorthalidone   Social History   Socioeconomic History   Marital status: Divorced    Spouse name: Not on file   Number of children: Not on file   Years of education: Not on file   Highest education level: Not on file  Occupational History   Not on file  Tobacco Use   Smoking status: Never   Smokeless tobacco: Never  Vaping Use   Vaping status: Never Used  Substance and Sexual Activity   Alcohol  use: Yes    Alcohol/week: 4.0 standard drinks of alcohol    Types: 4 Glasses of wine per week   Drug use: Never   Sexual activity: Not on file  Other Topics Concern   Not on file  Social History Narrative   Not on file   Social Drivers of Health   Financial Resource Strain: Patient Declined (06/16/2023)   Overall Financial Resource Strain (CARDIA)    Difficulty of Paying Living Expenses: Patient declined  Food Insecurity: No Food Insecurity (06/16/2023)    Hunger Vital Sign    Worried About Running Out of Food in the Last Year: Never true    Ran Out of Food in the Last Year: Never true  Transportation Needs: No Transportation Needs (06/16/2023)   PRAPARE - Administrator, Civil Service (Medical): No    Lack of Transportation (Non-Medical): No  Physical Activity: Sufficiently Active (06/16/2023)   Exercise Vital Sign    Days of Exercise per Week: 4 days    Minutes of Exercise per Session: 50 min  Stress: No Stress Concern Present (06/16/2023)   [employer] of Occupational Health - Occupational Stress Questionnaire    Feeling of Stress : Not at all  Social Connections: Socially Isolated (06/16/2023)   Social Connection and Isolation Panel    Frequency of Communication with Friends and Family: More than three times a week    Frequency of Social Gatherings with Friends and Family: More than three times a week    Attends Religious Services: Never    Database administrator or Organizations: No    Attends Engineer, structural: Never    Marital Status: Divorced     Family History: The patient's family history includes Heart attack in her father; Hypertension in her mother; Stroke in her mother. There is no history of Breast cancer.  ROS:   Please see the history of present illness.     All other systems reviewed and are negative.  EKGs/Labs/Other Studies Reviewed:    EKG Interpretation Date/Time:  Thursday April 20 2024 14:20:10 EDT Ventricular Rate:  105 PR Interval:  140 QRS Duration:  80 QT Interval:  326 QTC Calculation: 430 R Axis:   46  Text Interpretation: Sinus tachycardia  No acute ST/T wave changes. Confirmed by Vannie Mora ([id number]) on 04/20/2024 7:46:26 PM    Recent Labs: No results found for requested labs within last 365 days.   Recent Lipid Panel No results found for: CHOL, TRIG, HDL, CHOLHDL, VLDL, LDLCALC, LDLDIRECT  Physical Exam:   VS:  BP 120/66 (BP Location: Left  Arm, Patient Position: Sitting, Cuff Size: Normal)   Pulse (!) 101   Ht 5' (1.524 m)   Wt 133 lb 14.4 oz (60.7 kg)   SpO2 95%   BMI 26.15 kg/m  , BMI Body mass index is 26.15 kg/m. GENERAL:  Well appearing HEENT: Pupils equal round and reactive, fundi not visualized, oral mucosa unremarkable NECK:  No jugular venous distention, waveform within normal limits, carotid upstroke brisk and symmetric, no bruits, no thyromegaly LYMPHATICS:  No cervical adenopathy LUNGS:  Clear to auscultation bilaterally HEART:  RRR.  PMI not displaced or sustained,S1 and S2 within normal limits, no S3, no S4, no clicks, no rubs, no murmurs ABD:  Flat, positive bowel sounds normal in frequency in pitch, no bruits, no rebound, no guarding, no midline pulsatile mass, no hepatomegaly, no splenomegaly EXT:  2 plus pulses throughout, no edema, no cyanosis no clubbing SKIN:  No rashes no nodules NEURO:  Cranial nerves II through XII grossly intact, motor grossly intact throughout PSYCH:  Cognitively intact, oriented to person place and time   ASSESSMENT/PLAN:    Labile hypertension - BP at goal in clinic but reports elevated, labile readings at home. Increase Losartan  to 25mg  every evening. May continue to use Hydralazine  25mg  PRN for SBP >160/100. Discussed to monitor BP at home at least 2 hours after medications and sitting for 5-10 minutes. Recommend aiming for 150 minutes of moderate intensity activity per week and following a heart healthy diet.    ST - noted by EKG. Reports intermittent palpitations. Previously did not tolerate beta blocker. Recommend limit hydration, increase oral hydration, manage stress well.   HLD - Continue Pravastatin  Screening for Secondary Hypertension:     06/16/2023    8:18 AM  Causes  Drugs/Herbals Screened     - Comments limits salt.  2 teas/day  +NSAIDS rarely  Renovascular HTN Screened     - Comments check renal Dopplers  Sleep Apnea Screened     - Comments no symptoms   Thyroid Disease Screened  Hyperaldosteronism Screened     - Comments check renin and aldosterone  Pheochromocytoma Screened     - Comments check catecholamines and metanephrines  Cushing's Syndrome N/A  Hyperparathyroidism N/A  Coarctation of the Aorta Screened     - Comments BP symmetric  Compliance Screened    Relevant Labs/Studies:    Latest Ref Rng & Units 03/12/2020   11:09 AM 12/08/2017    9:08 AM 07/10/2012   12:50 PM  Basic Labs  Sodium 134 - 144 mmol/L 137  139  141   Potassium 3.5 - 5.2 mmol/L 4.3  4.0  4.2   Creatinine 0.57 - 1.00 mg/dL 9.14  9.27  9.29           Latest Ref Rng & Units 06/16/2023    9:02 AM  Renin/Aldosterone   Aldosterone 0.0 - 30.0 ng/dL 5.4   Aldos/Renin Ratio 0.0 - 30.0 1.7                Disposition:    FU with MD/APP/PharmD in 6 months    Medication Adjustments/Labs and Tests Ordered: Current medicines are reviewed at length with the patient today.  Concerns regarding medicines are outlined above.  Orders Placed This Encounter  Procedures   EKG 12-Lead   Meds ordered this encounter  Medications   losartan  (COZAAR ) 25 MG tablet    Sig: TAKE 1 TABLET DAILY IN THE EVENING    Dispense:  90 tablet    Refill:  2    Supervising Provider:   LONNI SLAIN [[id number]]     Signed, Sandra GORMAN Finder, NP  04/20/2024 8:09 PM    [facility]

## 2024-04-20 NOTE — Patient Instructions (Addendum)
 Medication Instructions:   CHANGE Losartan  to 25mg  (whole tablet) every evening  May take Hydralazine  25mg  as needed for blood pressure more than 160/100   Testing/Procedures: Your EKG today shows sinus tachycardia which is a normal but slightly fast heart rhythm    Follow-Up: Please follow up in 6 months in [facility] CLINIC with Dr. Raford, Reche Finder, NP or Allean Mink PharmD    Special Instructions:   To prevent lightheadedness Staying well hydrated Eating regular meals Limiting caffeine Making position changes slowly

## 2024-06-16 ENCOUNTER — Encounter (HOSPITAL_BASED_OUTPATIENT_CLINIC_OR_DEPARTMENT_OTHER): Admitting: Family

## 2024-07-04 ENCOUNTER — Other Ambulatory Visit (HOSPITAL_BASED_OUTPATIENT_CLINIC_OR_DEPARTMENT_OTHER): Payer: Self-pay | Admitting: Internal Medicine

## 2024-07-04 DIAGNOSIS — Z780 Asymptomatic menopausal state: Secondary | ICD-10-CM

## 2024-07-04 DIAGNOSIS — Z1231 Encounter for screening mammogram for malignant neoplasm of breast: Secondary | ICD-10-CM

## 2024-09-15 ENCOUNTER — Encounter (HOSPITAL_BASED_OUTPATIENT_CLINIC_OR_DEPARTMENT_OTHER): Payer: Self-pay

## 2024-09-15 ENCOUNTER — Other Ambulatory Visit: Payer: Self-pay

## 2024-09-15 ENCOUNTER — Emergency Department (HOSPITAL_BASED_OUTPATIENT_CLINIC_OR_DEPARTMENT_OTHER)
Admission: EM | Admit: 2024-09-15 | Discharge: 2024-09-15 | Disposition: A | Discharge status: Home / Self Care | Source: Home / Self Care | Attending: Emergency Medicine | Admitting: Emergency Medicine

## 2024-09-15 DIAGNOSIS — W260XXA Contact with knife, initial encounter: Secondary | ICD-10-CM | POA: Diagnosis not present

## 2024-09-15 DIAGNOSIS — S61412A Laceration without foreign body of left hand, initial encounter: Secondary | ICD-10-CM | POA: Diagnosis not present

## 2024-09-15 DIAGNOSIS — S6992XA Unspecified injury of left wrist, hand and finger(s), initial encounter: Secondary | ICD-10-CM | POA: Diagnosis present

## 2024-09-15 MED ORDER — BACITRACIN ZINC 500 UNIT/GM EX OINT
TOPICAL_OINTMENT | Freq: Once | CUTANEOUS | Status: AC
  Administered 2024-09-15: 31.5 via TOPICAL
  Filled 2024-09-15: qty 28.35

## 2024-09-15 MED ORDER — LIDOCAINE HCL (PF) 1 % IJ SOLN
5.0000 mL | Freq: Once | INTRAMUSCULAR | Status: AC
  Administered 2024-09-15: 5 mL
  Filled 2024-09-15: qty 5

## 2024-09-15 NOTE — ED Provider Notes (Signed)
 " [place] [facility] AT [facility] Provider Note   CSN: [id number] Arrival date & time: 09/15/24  9065     Patient presents with: Laceration   Sandra Bruce is a 79 y.o. female.   The history is provided by the patient.  Laceration Location:  Hand Hand laceration location:  L hand Length:  1.5 cm Patient presents with stab wound to left hand.  Was reportedly attempting to open a can to make a tie and the knife jumped off can onto her left hand to the dorsum of the hand between the 1st and 2nd finger.  Does have some tingling in the thumb.  Tetanus is reportedly up-to-date.     Prior to Admission medications  Medication Sig Start Date End Date Taking? Authorizing Provider  ALPRAZolam (XANAX PO) Take 0.25 mg by mouth at bedtime as needed.     [provider]  amitriptyline (ELAVIL) 25 MG tablet Take 25 mg by mouth at bedtime.    [provider]  Cyanocobalamin 5000 MCG SUBL Place 1 mg under the tongue daily.    [provider]  Docosanol 10 % CREA Apply topically.    [provider]  estrogens, conjugated, (PREMARIN) 0.625 MG tablet Take 0.625 mg by mouth daily. Take daily for 21 days then do not take for 7 days.    [provider]  guaiFENesin (MUCINEX) 600 MG 12 hr tablet Take 600 mg by mouth as needed for to loosen phlegm. Sinus    [provider]  hydrALAZINE  (APRESOLINE ) 25 MG tablet Take  25 mg tablet twice a day if needed for blood pressure greater than 160/100 02/28/20   Acharya, Gayatri A, MD  levothyroxine (SYNTHROID) 25 MCG tablet Take 1 tablet by mouth daily. 02/01/20   [provider]  losartan  (COZAAR ) 25 MG tablet TAKE 1 TABLET DAILY IN THE EVENING 04/20/24   Walker, Caitlin S, NP  mirtazapine (REMERON) 7.5 MG tablet Take 7.5 mg by mouth at bedtime. 02/22/24   [provider]  montelukast (SINGULAIR) 10 MG tablet Take 1 tablet by mouth as needed. 05/04/16   [provider]   Multiple Vitamin (MULTI-VITAMIN) tablet Take 1 tablet by mouth daily.    [provider]  omeprazole (PRILOSEC) 40 MG capsule Take 40 mg by mouth daily. 02/01/20   [provider]  pravastatin (PRAVACHOL) 20 MG tablet Take by mouth. 08/06/20   [provider]    Allergies: Olmesartan, Metoprolol tartrate, Wound dressing adhesive, Codeine, Hydrocodone, Sertraline, Atenolol, and Chlorthalidone    Review of Systems  Updated Vital Signs BP 130/70   Pulse 100   Temp 97.8 F (36.6 C) (Oral)   Resp 16   SpO2 97%   Physical Exam Vitals and nursing note reviewed.  Musculoskeletal:     Comments: Approximately 1.5 cm laceration in the area between the 1st and 2nd finger on the dorsum of the hand.  Does have some paresthesias to the left thumb.  Good movement of thumb however.  Good movement of first finger.  Does have a somewhat loose MCP joint.  Likely chronic per history.     (all labs ordered are listed, but only abnormal results are displayed) Labs Reviewed - No data to display  EKG: None  Radiology: No results found.   .Laceration Repair  Date/Time: 09/15/2024 10:10 AM  Performed by: Patsey Lot, MD Authorized by: Patsey Lot, MD   Consent:    Consent obtained:  Verbal   Consent given  by:  Patient   Risks, benefits, and alternatives were discussed: yes     Risks discussed:  Infection, need for additional repair, nerve damage, poor wound healing, tendon damage, retained foreign body, pain, poor cosmetic result and vascular damage   Alternatives discussed:  No treatment Universal protocol:    Patient identity confirmed:  Verbally with patient Anesthesia:    Anesthesia method:  Local infiltration   Local anesthetic:  Lidocaine 1% w/o epi Laceration details:    Location:  Hand   Hand location:  L hand, dorsum   Length (cm):  1.5 Pre-procedure details:    Preparation:  Patient was prepped and draped in usual sterile  fashion Exploration:    Limited defect created (wound extended): no     Hemostasis achieved with:  Direct pressure   Wound exploration: wound explored through full range of motion and entire depth of wound visualized     Wound extent: muscle damage     Contaminated: no   Treatment:    Area cleansed with:  Saline   Amount of cleaning:  Standard   Irrigation method:  Syringe   Debridement:  None   Undermining:  None Skin repair:    Repair method:  Sutures   Suture size:  4-0   Wound skin closure material used: vicryl rapide.   Suture technique:  Simple interrupted   Number of sutures:  5 Approximation:    Approximation:  Close Repair type:    Repair type:  Simple Post-procedure details:    Dressing:  Antibiotic ointment   Procedure completion:  Tolerated well, no immediate complications    Medications Ordered in the ED  bacitracin ointment (has no administration in time range)  lidocaine (PF) (XYLOCAINE) 1 % injection 5 mL (5 mLs Infiltration Given 09/15/24 0953)                                    Medical Decision Making Risk OTC drugs. Prescription drug management.   Patient stable to left hand.  Will irrigate and close wound.  Does have some paresthesia to the thumb but doubt severe injury.  Will close wound.  Do not think we need imaging at this time.  Wound closed.  Will follow-up with hand surgery since she has paresthesias in the thumb.  Has seen [facility] previously.     Final diagnoses:  Laceration of left hand without foreign body, initial encounter    ED Discharge Orders     None          Patsey Lot, MD 09/15/24 1012  "

## 2024-09-15 NOTE — ED Triage Notes (Signed)
 Lac to left hand due to knife. She was trying to use cut a hole in the top of a can. Unknown tdap.
